# Patient Record
Sex: Female | Born: 2005 | Race: Black or African American | Hispanic: No | Marital: Single | Smoking: Never smoker
Health system: Southern US, Community
[De-identification: ages and names within clinical notes are randomized; demographics above are authoritative.]

## PROBLEM LIST (undated history)

## (undated) ENCOUNTER — Inpatient Hospital Stay (HOSPITAL_COMMUNITY): Payer: Self-pay

## (undated) DIAGNOSIS — I10 Essential (primary) hypertension: Secondary | ICD-10-CM

## (undated) DIAGNOSIS — I499 Cardiac arrhythmia, unspecified: Secondary | ICD-10-CM

## (undated) DIAGNOSIS — J45909 Unspecified asthma, uncomplicated: Secondary | ICD-10-CM

## (undated) DIAGNOSIS — E119 Type 2 diabetes mellitus without complications: Secondary | ICD-10-CM

## (undated) DIAGNOSIS — O24419 Gestational diabetes mellitus in pregnancy, unspecified control: Secondary | ICD-10-CM

## (undated) DIAGNOSIS — G43909 Migraine, unspecified, not intractable, without status migrainosus: Secondary | ICD-10-CM

## (undated) HISTORY — DX: Essential (primary) hypertension: I10

## (undated) HISTORY — DX: Migraine, unspecified, not intractable, without status migrainosus: G43.909

## (undated) HISTORY — PX: NO PAST SURGERIES: SHX2092

---
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
## 2023-01-01 ENCOUNTER — Other Ambulatory Visit: Payer: Self-pay | Admitting: Advanced Practice Midwife

## 2023-01-01 LAB — STREP GP B NAA: Strep Gp B NAA: NEGATIVE

## 2023-01-02 ENCOUNTER — Encounter (HOSPITAL_COMMUNITY): Payer: Self-pay | Admitting: Family Medicine

## 2023-01-02 ENCOUNTER — Inpatient Hospital Stay (HOSPITAL_COMMUNITY)
Admission: AD | Admit: 2023-01-02 | Discharge: 2023-01-03 | DRG: 807 | Disposition: A | Discharge status: Home / Self Care | Payer: Medicaid Other | Attending: Family Medicine | Admitting: Family Medicine

## 2023-01-02 ENCOUNTER — Telehealth (HOSPITAL_COMMUNITY): Payer: Self-pay | Admitting: *Deleted

## 2023-01-02 ENCOUNTER — Other Ambulatory Visit: Payer: Self-pay

## 2023-01-02 ENCOUNTER — Encounter (HOSPITAL_COMMUNITY): Payer: Self-pay

## 2023-01-02 DIAGNOSIS — Z91010 Allergy to peanuts: Secondary | ICD-10-CM

## 2023-01-02 DIAGNOSIS — O4202 Full-term premature rupture of membranes, onset of labor within 24 hours of rupture: Secondary | ICD-10-CM

## 2023-01-02 DIAGNOSIS — O9952 Diseases of the respiratory system complicating childbirth: Secondary | ICD-10-CM | POA: Diagnosis present

## 2023-01-02 DIAGNOSIS — O24425 Gestational diabetes mellitus in childbirth, controlled by oral hypoglycemic drugs: Principal | ICD-10-CM | POA: Diagnosis present

## 2023-01-02 DIAGNOSIS — O24419 Gestational diabetes mellitus in pregnancy, unspecified control: Secondary | ICD-10-CM

## 2023-01-02 DIAGNOSIS — Z3A37 37 weeks gestation of pregnancy: Secondary | ICD-10-CM

## 2023-01-02 DIAGNOSIS — O24429 Gestational diabetes mellitus in childbirth, unspecified control: Principal | ICD-10-CM | POA: Diagnosis present

## 2023-01-02 DIAGNOSIS — O99214 Obesity complicating childbirth: Secondary | ICD-10-CM | POA: Diagnosis present

## 2023-01-02 DIAGNOSIS — O24424 Gestational diabetes mellitus in childbirth, insulin controlled: Secondary | ICD-10-CM

## 2023-01-02 DIAGNOSIS — J4520 Mild intermittent asthma, uncomplicated: Secondary | ICD-10-CM | POA: Diagnosis present

## 2023-01-02 DIAGNOSIS — A749 Chlamydial infection, unspecified: Secondary | ICD-10-CM | POA: Diagnosis present

## 2023-01-02 DIAGNOSIS — O9832 Other infections with a predominantly sexual mode of transmission complicating childbirth: Secondary | ICD-10-CM

## 2023-01-02 DIAGNOSIS — O26893 Other specified pregnancy related conditions, third trimester: Secondary | ICD-10-CM | POA: Diagnosis present

## 2023-01-02 LAB — CBC
HCT: 31.1 % — ABNORMAL LOW (ref 36.0–46.0)
Hemoglobin: 10.1 g/dL — ABNORMAL LOW (ref 12.0–15.0)
MCH: 24.3 pg — ABNORMAL LOW (ref 26.0–34.0)
MCHC: 32.5 g/dL (ref 30.0–36.0)
MCV: 74.9 fL — ABNORMAL LOW (ref 80.0–100.0)
Platelets: 237 10*3/uL (ref 150–400)
RBC: 4.15 MIL/uL (ref 3.87–5.11)
RDW: 13.8 % (ref 11.5–15.5)
WBC: 5.4 10*3/uL (ref 4.0–10.5)
nRBC: 0 % (ref 0.0–0.2)

## 2023-01-02 LAB — CERVICOVAGINAL ANCILLARY ONLY
Chlamydia: POSITIVE — AB
Comment: NEGATIVE
Comment: NEGATIVE
Comment: NORMAL
Neisseria Gonorrhea: NEGATIVE
Trichomonas: NEGATIVE

## 2023-01-02 LAB — TYPE AND SCREEN
ABO/RH(D): A POS
Antibody Screen: NEGATIVE

## 2023-01-02 MED ORDER — MISOPROSTOL 25 MCG QUARTER TABLET: 25.0000 ug | ORAL_TABLET | Freq: Once | ORAL | Status: DC

## 2023-01-02 MED ORDER — PHENYLEPHRINE 80 MCG/ML (10ML) SYRINGE FOR IV PUSH (FOR BLOOD PRESSURE SUPPORT): 80.0000 ug | PREFILLED_SYRINGE | INTRAVENOUS | Status: DC | PRN

## 2023-01-02 MED ORDER — WITCH HAZEL-GLYCERIN EX PADS: 1.0000 | MEDICATED_PAD | CUTANEOUS | Status: DC | PRN

## 2023-01-02 MED ORDER — LIDOCAINE HCL (PF) 1 % IJ SOLN: 30.0000 mL | INTRAMUSCULAR | Status: DC | PRN

## 2023-01-02 MED ORDER — SENNOSIDES-DOCUSATE SODIUM 8.6-50 MG PO TABS: 2.0000 | ORAL_TABLET | ORAL | Status: DC

## 2023-01-02 MED ORDER — TETANUS-DIPHTH-ACELL PERTUSSIS 5-2.5-18.5 LF-MCG/0.5 IM SUSY: 0.5000 mL | PREFILLED_SYRINGE | Freq: Once | INTRAMUSCULAR | Status: DC

## 2023-01-02 MED ORDER — MISOPROSTOL 50MCG HALF TABLET: 50.0000 ug | ORAL_TABLET | Freq: Once | ORAL | Status: DC

## 2023-01-02 MED ORDER — EPHEDRINE 5 MG/ML INJ: 10.0000 mg | INTRAVENOUS | Status: DC | PRN

## 2023-01-02 MED ORDER — ONDANSETRON HCL 4 MG/2ML IJ SOLN: 4.0000 mg | Freq: Four times a day (QID) | INTRAMUSCULAR | Status: DC | PRN

## 2023-01-02 MED ORDER — LACTATED RINGERS IV SOLN: 500.0000 mL | Freq: Once | INTRAVENOUS | Status: DC

## 2023-01-02 MED ORDER — BENZOCAINE-MENTHOL 20-0.5 % EX AERO: 1.0000 | INHALATION_SPRAY | CUTANEOUS | Status: DC | PRN

## 2023-01-02 MED ORDER — COCONUT OIL OIL: 1.0000 | TOPICAL_OIL | Status: DC | PRN

## 2023-01-02 MED ORDER — DIPHENHYDRAMINE HCL 50 MG/ML IJ SOLN: 12.5000 mg | INTRAMUSCULAR | Status: DC | PRN

## 2023-01-02 MED ORDER — ACETAMINOPHEN 325 MG PO TABS: 650.0000 mg | ORAL_TABLET | ORAL | Status: DC | PRN

## 2023-01-02 MED ORDER — MEASLES, MUMPS & RUBELLA VAC IJ SOLR: 0.5000 mL | Freq: Once | INTRAMUSCULAR | Status: DC

## 2023-01-02 MED ORDER — DIBUCAINE (PERIANAL) 1 % EX OINT: 1.0000 | TOPICAL_OINTMENT | CUTANEOUS | Status: DC | PRN

## 2023-01-02 MED ORDER — OXYTOCIN-SODIUM CHLORIDE 30-0.9 UT/500ML-% IV SOLN
2.5000 [IU]/h | INTRAVENOUS | Status: DC
  Administered 2023-01-02: 2.5 [IU]/h via INTRAVENOUS
  Filled 2023-01-02: qty 500

## 2023-01-02 MED ORDER — LACTATED RINGERS IV SOLN: INTRAVENOUS | Status: DC

## 2023-01-02 MED ORDER — DIPHENHYDRAMINE HCL 25 MG PO CAPS: 25.0000 mg | ORAL_CAPSULE | Freq: Four times a day (QID) | ORAL | Status: DC | PRN

## 2023-01-02 MED ORDER — PRENATAL MULTIVITAMIN CH: 1.0000 | ORAL_TABLET | Freq: Every day | ORAL | Status: DC

## 2023-01-02 MED ORDER — FENTANYL-BUPIVACAINE-NACL 0.5-0.125-0.9 MG/250ML-% EP SOLN
EPIDURAL | Status: AC
  Filled 2023-01-02: qty 250

## 2023-01-02 MED ORDER — SIMETHICONE 80 MG PO CHEW: 80.0000 mg | CHEWABLE_TABLET | ORAL | Status: DC | PRN

## 2023-01-02 MED ORDER — ONDANSETRON HCL 4 MG/2ML IJ SOLN: 4.0000 mg | INTRAMUSCULAR | Status: DC | PRN

## 2023-01-02 MED ORDER — LACTATED RINGERS IV SOLN: 500.0000 mL | INTRAVENOUS | Status: DC | PRN

## 2023-01-02 MED ORDER — OXYTOCIN BOLUS FROM INFUSION
333.0000 mL | Freq: Once | INTRAVENOUS | Status: AC
  Administered 2023-01-02: 333 mL via INTRAVENOUS

## 2023-01-02 MED ORDER — ONDANSETRON HCL 4 MG PO TABS: 4.0000 mg | ORAL_TABLET | ORAL | Status: DC | PRN

## 2023-01-02 MED ORDER — TERBUTALINE SULFATE 1 MG/ML IJ SOLN: 0.2500 mg | Freq: Once | INTRAMUSCULAR | Status: DC | PRN

## 2023-01-02 MED ORDER — OXYCODONE-ACETAMINOPHEN 5-325 MG PO TABS: 2.0000 | ORAL_TABLET | ORAL | Status: DC | PRN

## 2023-01-02 MED ORDER — IBUPROFEN 600 MG PO TABS
600.0000 mg | ORAL_TABLET | Freq: Four times a day (QID) | ORAL | Status: DC
  Administered 2023-01-03: 600 mg via ORAL
  Filled 2023-01-02 (×2): qty 1

## 2023-01-02 MED ORDER — OXYCODONE-ACETAMINOPHEN 5-325 MG PO TABS: 1.0000 | ORAL_TABLET | ORAL | Status: DC | PRN

## 2023-01-02 MED ORDER — FENTANYL-BUPIVACAINE-NACL 0.5-0.125-0.9 MG/250ML-% EP SOLN: 12.0000 mL/h | EPIDURAL | Status: DC | PRN

## 2023-01-02 NOTE — H&P (Signed)
OBSTETRIC ADMISSION HISTORY AND PHYSICAL  Chelsea Herman is a 17 y.o. female G2P1001 with IUP at [redacted]w[redacted]d presenting for SROM and active labor. States labor started 30 min ago. Reports taking castor oil last night. She reports +FMs. No LOF, VB, blurry vision, headaches, peripheral edema, or RUQ pain. She plans on breastfeeding. She is undecided on birth control.  Dating: By early [place] --->  Estimated Date of Delivery: 01/23/23  Sono:    @[redacted]w[redacted]d , normal anatomy, cephalic presentation, 3762G, 60%ile, EFW 5'  Prenatal History/Complications: - teen pregnancy - A2GDM, poor control - Chlamydia  Past Medical History: Past Medical History:  Diagnosis Date   Asthma    Diabetes mellitus without complication ([place])    Gestational diabetes    History of gestational hypertension 02/04/2021    Past Surgical History: Past Surgical History:  Procedure Laterality Date   NO PAST SURGERIES      Obstetrical History: OB History     Gravida  2   Para  1   Term  1   Preterm  0   AB  0   Living  1      SAB  0   IAB  0   Ectopic  0   Multiple      Live Births  1           Social History: Social History   Socioeconomic History   Marital status: Single    Spouse name: Not on file   Number of children: Not on file   Years of education: Not on file   Highest education level: Not on file  Occupational History   Not on file  Tobacco Use   Smoking status: Never    Passive exposure: Never   Smokeless tobacco: Never  Vaping Use   Vaping Use: Never used  Substance and Sexual Activity   Alcohol use: Not Currently   Drug use: Not Currently   Sexual activity: Not Currently    Partners: Male    Birth control/protection: None  Other Topics Concern   Not on file  Social History Narrative   ** Merged History Encounter **       Social Determinants of Health   Financial Resource Strain: Not on file  Food Insecurity: Not on file  Transportation Needs: Not on file  Physical  Activity: Not on file  Stress: Not on file  Social Connections: Not on file    Family History: Family History  Problem Relation Age of Onset   Hearing loss Mother    Asthma Neg Hx    Cancer Neg Hx    Diabetes Neg Hx    Stroke Neg Hx     Allergies: Allergies  Allergen Reactions   Peanut-Containing Drug Products Anaphylaxis   Citric Acid Rash    Medications Prior to Admission  Medication Sig Dispense Refill Last Dose   Accu-Chek Softclix Lancets lancets Use 1 lancet to check blood glucose 4 times daily 100 each 6    aspirin EC 81 MG tablet Take 1 tablet (81 mg total) by mouth daily. Take after 12 weeks for prevention of preeclampsia later in pregnancy (Patient not taking: Reported on 11/08/2022) 300 tablet 2    Blood Pressure Monitoring (BLOOD PRESSURE KIT) DEVI 1 kit by Does not apply route once a week. 1 each 0    glucose blood (ACCU-CHEK GUIDE) test strip Use 1 test strip to check blood glucose 4 times daily 100 each 6    metFORMIN (GLUCOPHAGE-XR) 500 MG 24  hr tablet Take 2 tablets (1,000 mg total) by mouth 2 (two) times daily with a meal. 120 tablet 5    metoCLOPramide (REGLAN) 10 MG tablet Take 1 tablet (10 mg total) by mouth every 6 (six) hours. (Patient not taking: Reported on 07/05/2022) 30 tablet 0    Prenatal Vit-Fe Fumarate-FA (PREPLUS) 27-1 MG TABS Take 1 tablet by mouth daily. 30 tablet 13      Review of Systems:  All systems reviewed and negative except as stated in HPI  PE: Last menstrual period 03/30/2022, currently breastfeeding. General appearance: alert, cooperative, and moderate distress Lungs: regular rate and effort Heart: regular rate  Abdomen: soft, non-tender Extremities: Homans sign is negative, no sign of DVT Presentation: cephalic EFM: 150 bpm, mod variability, no accels, variable decels Toco: q3-4 Dilation: 9 Exam by:: Shelly Coss, CNM [facility]  Prenatal labs: ABO, Rh: A/Positive/-- (07/17 1029) Antibody: Negative (07/17 1029) Rubella: 2.11 (07/17  1029) RPR: Non Reactive (11/06 1034)  HBsAg: Negative (07/17 1029)  HIV: Non Reactive (11/06 1034)  GBS: Negative/-- (01/05 1249)  2 hr GTT 85/202/140  Prenatal Transfer Tool  Maternal Diabetes: Yes:  Diabetes Type:  Insulin/Medication controlled Genetic Screening: Normal Maternal Ultrasounds/Referrals: Normal Fetal Ultrasounds or other Referrals:  Referred to Materal Fetal Medicine  Maternal Substance Abuse:  No Significant Maternal Medications:  Meds include: Other: Metformin Significant Maternal Lab Results: Group B Strep negative  No results found for this or any previous visit (from the past 24 hour(s)).  Patient Active Problem List   Diagnosis Date Noted   Gestational diabetes mellitus in childbirth, unspecified control 01/02/2023   Chlamydia infection complicating pregnancy 11/15/2022   Gestational diabetes mellitus (GDM) affecting pregnancy, antepartum 11/01/2022   Maternal obesity affecting pregnancy, antepartum 10/31/2022   Supervision of other normal pregnancy, antepartum 07/05/2022   Intrauterine pregnancy in teenager 06/04/2022   Subchorionic hematoma in first trimester 06/04/2022   History of gestational hypertension 02/04/2021   GERD (gastroesophageal reflux disease) 09/08/2020   Mild intermittent asthma without complication 08/07/2017   Peanut allergy 08/07/2017    Assessment: Chelsea Herman is a 17 y.o. G2P1001 at [redacted]w[redacted]d here for SROM and labor  1. Labor: active 2. FWB: Cat II 3. Pain: desires epidural 4. GBS: neg   Plan: Admit to LD Anticipate precipitous SVD  Donette Larry, CNM  01/02/2023, 2:27 PM

## 2023-01-02 NOTE — Telephone Encounter (Signed)
Preadmission screen  

## 2023-01-02 NOTE — Lactation Note (Signed)
This note was copied from a baby's chart. Lactation Consultation Note  Patient Name: Chelsea Herman [id number] Date: 01/02/2023 Reason for consult: Initial assessment;Early term 37-38.6wks;Maternal endocrine disorder Age:19 hours RN had just given baby glucose gel. LC attempted to get baby to latch to the breast. Only latched a couple of times w/a couple of sucks. Baby was jittery, and sleepy. LC collected some colostrum and spoon fed baby then laid him on mom's chest STS and covered. Discussed w/mom the need to supplement for at least 24 hrs to keep glucose levels good. Discussed how it takes time for baby to regulate himself and will need to be supplemented. Discussed w/mom pumping.  Mom shown how to use DEBP & how to disassemble, clean, & reassemble parts. Discussed pumping q3hrs. Mom concerned baby will not want her breast if given a bottle. LC gave mom a foley cup to give supplement with since mom has short shafted nipples. Mom stated she BF her daughter for 15 months w/o any difficulty. Mom didn't have any issues latching. Mom stated this baby is different not latching as well as her daughter. Mom encouraged to feed baby 8-12 times/24 hours and with feeding cues.  Encouraged to call for assistance or questions.  Maternal Data Has patient been taught Hand Expression?: Yes Does the patient have breastfeeding experience prior to this delivery?: Yes How long did the patient breastfeed?: 1 yr and 3 months  Feeding    LATCH Score Latch: Too sleepy or reluctant, no latch achieved, no sucking elicited.  Audible Swallowing: None  Type of Nipple: Everted at rest and after stimulation (semi flat/everts w/stimulation)  Comfort (Breast/Nipple): Soft / non-tender  Hold (Positioning): Assistance needed to correctly position infant at breast and maintain latch.  LATCH Score: 5   Lactation Tools Discussed/Used Tools: Pump;Shells;Flanges Flange Size: 21 Breast pump type: Double-Electric  Breast Pump Pump Education: Setup, frequency, and cleaning;Milk Storage Reason for Pumping: supplementation Pumping frequency: q3hr  Interventions Interventions: Breast feeding basics reviewed;Adjust position;DEBP;Assisted with latch;Support pillows;Skin to skin;Position options;Breast massage;Expressed milk;Hand express;LC Magazine features editor;Shells;Pre-pump if needed;Breast compression  Discharge    Consult Status Consult Status: Follow-up Date: 01/03/23 Follow-up type: In-patient    Theodoro Kalata 01/02/2023, 8:53 PM

## 2023-01-02 NOTE — Discharge Summary (Signed)
Postpartum Discharge Summary  Date of Service updated***     Patient Name: Chelsea Herman DOB: 01/31/2006 MRN: [id number]  Date of admission: 01/02/2023 Delivery date:01/02/2023  Delivering provider: Donette Larry  Date of discharge: 01/02/2023  Admitting diagnosis: Gestational diabetes mellitus in childbirth, unspecified control [O24.429] Intrauterine pregnancy: [redacted]w[redacted]d     Secondary diagnosis:  Principal Problem:   Gestational diabetes mellitus in childbirth, unspecified control Active Problems:   Chlamydia infection complicating pregnancy   SVD (spontaneous vaginal delivery)  Additional problems: precipitous labor    Discharge diagnosis: Term Pregnancy Delivered and GDM A2                                              Post partum procedures:{Postpartum procedures:23558} Augmentation: N/A Complications: None  Hospital course: Onset of Labor With Vaginal Delivery      17 y.o. yo G2P1001 at [redacted]w[redacted]d was admitted in Active Labor on 01/02/2023. Labor course was uncomplicated. Membrane Rupture Time/Date: 1:30 PM ,01/02/2023   Delivery Method:Vaginal, Spontaneous  Episiotomy: None  Lacerations:  1st degree  Patient had a postpartum course complicated by ***.  She is ambulating, tolerating a regular diet, passing flatus, and urinating well. Patient is discharged home in stable condition on 01/02/23.  Newborn Data: Birth date:01/02/2023  Birth time:2:30 PM  Gender:Female  Living status:Living  Apgars:8 ,8  Weight:   Magnesium Sulfate received: {Mag received:30440022} BMZ received: {BMZ received:30440023} Rhophylac:{Rhophylac received:30440032} PPI:{RJJ:88416606} T-DaP:{Tdap:23962} Flu: {TKZ:60109} Transfusion:{Transfusion received:30440034}  Physical exam  Vitals:   01/02/23 1426 01/02/23 1443 01/02/23 1451  BP: 133/78 133/83 128/86  Pulse: 97 75 62  Resp:  18   Temp:  98.4 F (36.9 C)   TempSrc:  Axillary    General: {Exam; general:21111117} Lochia: {Desc;  appropriate/inappropriate:30686::"appropriate"} Uterine Fundus: {Desc; firm/soft:30687} Incision: {Exam; incision:21111123} DVT Evaluation: {Exam; dvt:2111122} Labs: Lab Results  Component Value Date   WBC 5.4 01/02/2023   HGB 10.1 (L) 01/02/2023   HCT 31.1 (L) 01/02/2023   MCV 74.9 (L) 01/02/2023   PLT 237 01/02/2023      Latest Ref Rng & Units 07/11/2022   10:29 AM  CMP  Glucose 70 - 99 mg/dL 98   BUN 5 - 18 mg/dL 4   Creatinine 3.23 - 5.57 mg/dL 3.22   Sodium 025 - 427 mmol/L 135   Potassium 3.5 - 5.2 mmol/L 3.9   Chloride 96 - 106 mmol/L 103   CO2 20 - 29 mmol/L 19   Calcium 8.9 - 10.4 mg/dL 9.2   Total Protein 6.0 - 8.5 g/dL 6.7   Total Bilirubin 0.0 - 1.2 mg/dL 0.2   Alkaline Phos 47 - 113 IU/L 81   AST 0 - 40 IU/L 15   ALT 0 - 24 IU/L 10    Edinburgh Score:    04/14/2021    1:45 PM  Edinburgh Postnatal Depression Scale Screening Tool  I have been able to laugh and see the funny side of things. 0  I have looked forward with enjoyment to things. 0  I have blamed myself unnecessarily when things went wrong. 0  I have been anxious or worried for no good reason. 0  I have felt scared or panicky for no good reason. 0  Things have been getting on top of me. 0  I have been so unhappy that I have had difficulty sleeping. 0  I have  felt sad or miserable. 0  I have been so unhappy that I have been crying. 0  The thought of harming myself has occurred to me. 0  Edinburgh Postnatal Depression Scale Total 0     After visit meds:  Allergies as of 01/02/2023       Reactions   Peanut-containing Drug Products Anaphylaxis   Citric Acid Rash     Med Rec must be completed prior to using this [place]***        Discharge home in stable condition Infant Feeding: {Baby feeding:23562} Infant Disposition:{CHL IP OB HOME WITH RCVELF:81017} Discharge instruction: per After Visit Summary and Postpartum booklet. Activity: Advance as tolerated. Pelvic rest for 6 weeks.   Diet: {OB PZWC:58527782} Future Appointments: Future Appointments  Date Time Provider [place]  01/03/2023  7:15 AM [facility] SCHED ROOM [facility] None  01/06/2023  9:55 AM Constant, Vickii Chafe, MD [facility] None  01/09/2023  3:30 PM [facility] NURSE [facility]  01/09/2023  3:45 PM [facility] US1 [facility]  01/13/2023  9:55 AM Constant, Vickii Chafe, MD [facility] None  01/19/2023 11:15 AM Constant, Vickii Chafe, MD [place] None   Follow up Visit:  Message sent by Drake Leach, CNM on 01/02/23: Please schedule this patient for a In person postpartum visit in 6 weeks with the following provider: Any provider. Additional Postpartum F/U:2 hour GTT  Low risk pregnancy complicated by: GDM Delivery mode:  Vaginal, Spontaneous  Anticipated Birth Control:  Unsure   01/02/2023 Julianne Handler, CNM

## 2023-01-02 NOTE — MAU Note (Addendum)
Patient arrived to MAU with complaints of "something hanging out of her vagina." MAU registration notified this RN immediately. RN immediately went to the lobby to retrieve patient. Mason Jim, NT, retrieved Gavin Pound, CNM, immediately.   Patient brought to the room and stated she thought it was her mucous plug that was hanging out. CNM at bedside immediately. No cord noted externally. RN applied monitors. FHR reassuring at 145 initially. CNM performed SVE with patient consent. Patient found to be 9 cm and grossly ruptured with meconium. No cord felt.  First Call L&D notified and report given.  Reather Converse, RN, notified L&D Charge RN and report given. Patient to go to room 213.   Gavin Pound, CNM, and Reather Converse, RN, transported patient to L&D.

## 2023-01-03 ENCOUNTER — Inpatient Hospital Stay (HOSPITAL_COMMUNITY)
Admission: RE | Admit: 2023-01-03 | Payer: Medicaid Other | Source: Home / Self Care | Admitting: Obstetrics & Gynecology

## 2023-01-03 ENCOUNTER — Inpatient Hospital Stay (HOSPITAL_COMMUNITY): Payer: Medicaid Other

## 2023-01-03 LAB — RPR: RPR Ser Ql: NONREACTIVE

## 2023-01-03 LAB — GLUCOSE, CAPILLARY: Glucose-Capillary: 81 mg/dL (ref 70–99)

## 2023-01-03 MED ORDER — IBUPROFEN 600 MG PO TABS: 600.0000 mg | ORAL_TABLET | Freq: Four times a day (QID) | ORAL | 0 refills | Status: DC

## 2023-01-03 MED ORDER — ACETAMINOPHEN 325 MG PO TABS: 650.0000 mg | ORAL_TABLET | ORAL | 0 refills | Status: DC | PRN

## 2023-01-03 NOTE — Lactation Note (Signed)
This note was copied from a baby's chart. Lactation Consultation Note  Patient Name: Boy Nahia Nissan [id number] Date: 01/03/2023 Reason for consult: Follow-up assessment;Early term 37-38.6wks, -1.11% weight loss, see Birth Parent MR- hx GDM Age:19 hours Birth Parent current feeding preference  is : Breast and formula feeding infant. LC did not observe latch due Birth Parent BF infant for 10 minutes and supplement infant with 10 mls of formula at 1730 pm. Infant appeared content in basinet. Birth Parent is experienced with breast feeding see maternal data below. Per Birth Parent, infant latches well just little soreness infant has strong latch will apply EBM let air dry or use coconut oil. Birth Parent doesn't have abrasions on nipples is using the DEBP to pre-pump breast prior to latching infant, not been using the breast shells that was given previously by LC. LC reviewed importance of using the DEBP to help stimulate and establish her milk supply , Birth Parent mostly been using hand pump.  LC reviewed how to use DEBP with Birth Parent, she will pump later today as LC end of [facility] consult family members enter the room. Birth Parent current feeding plan: 1- Will continue to pre-pump breast prior to  chest feeding  infant according to hunger cues, on demand 8+ times within 24 hours, STS. 2- Plans to limit total feedings to 30 minutes or less, 20 minutes at the breast or less and then 10 minutes supplementing infant with any EBM 1st and then formula.Birth Parent has BF supplemental sheet  and plans to offer Day 2  (68mls or more),  if infant wants it after infant latches at the breast. 3- Will start using the DEBP every 3 hours for 15 minutes on initial setting. 4- Birth Parent knows to call [place] if their are any BF questions, concerns or if she needs latch assistance. Maternal Data Does the patient have breastfeeding experience prior to this delivery?: Yes How long did the patient breastfeed?: Birth  Parent BF 1st child for 15 months who is currently less than 11 years old.  Feeding Mother's Current Feeding Choice: Breast Milk and Formula Nipple Type: Slow - flow  LATCH Score                    Lactation Tools Discussed/Used    Interventions Interventions: Education;Skin to skin;Pre-pump if needed;Shells;Breast compression;Hand pump;DEBP  Discharge    Consult Status Consult Status: Follow-up Date: 01/04/23 Follow-up type: In-patient    Eulis Canner 01/03/2023, 6:33 PM

## 2023-01-04 ENCOUNTER — Ambulatory Visit: Payer: Self-pay

## 2023-01-04 NOTE — Lactation Note (Addendum)
This note was copied from a baby's chart. Lactation Consultation Note  Patient Name: Chelsea Herman [id number] Date: 01/04/2023 Age: 19 hours, experienced BF x 13 months per mom.  Reason for consult: Follow-up assessment;Early term 37-38.6wks;Maternal endocrine disorder;Infant weight loss (4 % weight loss. LC reviewed and updated the doc flow sheets per  mom for the last 2 feedings. Last serum blood sugar 62. [place] for D/C) Baby has received several bottles. LC reviewed supply and demand, importance of giving the baby a chance to latch as much as possible to work on the BF and if baby is still hungry give the baby the opporttunity to latch on the 2nd breast.  Mom has [facility] Medicaid, [facility] doesn't accept it.  Per mom has not been active with this pregnancy with WIC .  LC mentioned to mom a [facility] referral would be sent today to [facility].  LC recommended since she is going home with just a hand pump - prior to latching on the 1st breast - breast massage , hand express, pre-pump to enhance the milk coming in.  LC reviewed BF D/C teaching and the [facility] resources after D/C.  LC provided mom information to [facility] and [facility] so she can call them.  LC sent a [place] referral to [place] as mom requested. Faxed.  Maternal Data  Per mom BF her 1st baby 13 months . [place] praised her for the 13 months and LC mentioned the benefit to her 2nd baby.   Feeding Mother's Current Feeding Choice: Breast Milk and Formula Nipple Type: Slow - flow  LATCH Score - LC unable to check a latch , per mom the baby fed at the breast at 1:30 pm for 15 mins.     Lactation Tools Discussed/Used Tools: Shells;Pump Flange Size: 21 Breast pump type: Manual;Double-Electric Breast Pump Pump Education: Milk Storage  Interventions Interventions: Breast feeding basics reviewed;Education;DEBP;Hand pump;LC Services brochure  Discharge Discharge Education: Engorgement and breast care;Warning signs for feeding baby Pump: Manual WIC Program:  No  Consult Status Consult Status: Complete Date: 01/04/23    Myer Haff 01/04/2023, 2:53 PM

## 2023-01-05 ENCOUNTER — Other Ambulatory Visit: Payer: Self-pay

## 2023-01-05 ENCOUNTER — Telehealth: Payer: Self-pay

## 2023-01-05 ENCOUNTER — Encounter (HOSPITAL_COMMUNITY): Payer: Self-pay | Admitting: Obstetrics and Gynecology

## 2023-01-05 ENCOUNTER — Inpatient Hospital Stay (HOSPITAL_COMMUNITY)
Admission: AD | Admit: 2023-01-05 | Discharge: 2023-01-05 | Disposition: A | Discharge status: Home / Self Care | Payer: Medicaid Other | Attending: Obstetrics and Gynecology | Admitting: Obstetrics and Gynecology

## 2023-01-05 DIAGNOSIS — N939 Abnormal uterine and vaginal bleeding, unspecified: Secondary | ICD-10-CM | POA: Diagnosis present

## 2023-01-05 DIAGNOSIS — G44209 Tension-type headache, unspecified, not intractable: Secondary | ICD-10-CM | POA: Diagnosis not present

## 2023-01-05 DIAGNOSIS — O9089 Other complications of the puerperium, not elsewhere classified: Secondary | ICD-10-CM | POA: Insufficient documentation

## 2023-01-05 MED ORDER — DOXYCYCLINE MONOHYDRATE 100 MG PO TABS: 100.0000 mg | ORAL_TABLET | Freq: Two times a day (BID) | ORAL | 0 refills | Status: DC

## 2023-01-05 NOTE — Telephone Encounter (Signed)
Contacted pt about results and treatment, no answer, left vm

## 2023-01-05 NOTE — MAU Provider Note (Signed)
History     CSN: [id number]  Arrival date and time: 01/05/23 1123   Event Date/Time   First Provider Initiated Contact with Patient 01/05/23 1147      Chief Complaint  Patient presents with   Headache   Vaginal Bleeding   HPI  Chelsea Herman is a 17 y.o. G2P2002 postpartum from a vaginal delivery on 1/8 who presents for evaluation of headache and vaginal bleeding. Patient reports she was told if she got a headache or bleeding to return to the hospital and she has both so she came in. Patient rates the pain as a 6/10 and has not tried anything for the pain. She has not eaten or drank any water today. She reports the bleeding is currently minimal but she saw a clot yesterday so she came in.   OB History     Gravida  2   Para  2   Term  2   Preterm  0   AB  0   Living  2      SAB  0   IAB  0   Ectopic  0   Multiple  0   Live Births  2           Past Medical History:  Diagnosis Date   Asthma    Diabetes mellitus without complication ([place])    Gestational diabetes    History of gestational hypertension 02/04/2021    Past Surgical History:  Procedure Laterality Date   NO PAST SURGERIES      Family History  Problem Relation Age of Onset   Hearing loss Mother    Asthma Neg Hx    Cancer Neg Hx    Diabetes Neg Hx    Stroke Neg Hx     Social History   Tobacco Use   Smoking status: Never    Passive exposure: Never   Smokeless tobacco: Never  Vaping Use   Vaping Use: Never used  Substance Use Topics   Alcohol use: Not Currently   Drug use: Not Currently    Allergies:  Allergies  Allergen Reactions   Peanut-Containing Drug Products Anaphylaxis   Citric Acid Rash    No medications prior to admission.    Review of Systems  Constitutional: Negative.  Negative for fatigue and fever.  HENT: Negative.    Respiratory: Negative.  Negative for shortness of breath.   Cardiovascular: Negative.  Negative for chest pain.  Gastrointestinal:  Negative.  Negative for abdominal pain, constipation, diarrhea, nausea and vomiting.  Genitourinary:  Positive for vaginal bleeding. Negative for dysuria.  Neurological:  Positive for headaches. Negative for dizziness.   Physical Exam   Blood pressure 112/63, pulse 83, temperature 98.3 F (36.8 C), temperature source Oral, resp. rate 16, height 5\' 3"  (1.6 m), weight 82.6 kg, SpO2 100 %, currently breastfeeding.  Patient Vitals for the past 24 hrs:  BP Temp Temp src Pulse Resp SpO2 Height Weight  01/05/23 1146 112/63 -- -- 83 -- -- -- --  01/05/23 1144 -- -- -- -- -- 100 % -- --  01/05/23 1143 114/69 -- -- 75 -- -- -- --  01/05/23 1133 129/66 98.3 F (36.8 C) Oral 71 16 98 % -- --  01/05/23 1129 -- -- -- -- -- -- 5\' 3"  (1.6 m) 82.6 kg    Physical Exam Vitals and nursing note reviewed.  Constitutional:      General: She is not in acute distress.    Appearance: She is well-developed.  HENT:     Head: Normocephalic.  Eyes:     Pupils: Pupils are equal, round, and reactive to light.  Cardiovascular:     Rate and Rhythm: Normal rate and regular rhythm.     Heart sounds: Normal heart sounds.  Pulmonary:     Effort: Pulmonary effort is normal. No respiratory distress.     Breath sounds: Normal breath sounds.  Abdominal:     General: Bowel sounds are normal. There is no distension.     Palpations: Abdomen is soft.     Tenderness: There is no abdominal tenderness.  Skin:    General: Skin is warm and dry.  Neurological:     Mental Status: She is alert and oriented to person, place, and time.  Psychiatric:        Mood and Affect: Mood normal.        Behavior: Behavior normal.        Thought Content: Thought content normal.        Judgment: Judgment normal.     MAU Course  Procedures  Patient declines speculum exam and agreeable to let CNM look at pad. Minimal bright red blood on pad. Reassurance provided of normalcy.  Offered treatment of HA and patient declines. Discussed  importance of PO hydration and eating regularly in the postpartum period.  Assessment and Plan   1. Acute non intractable tension-type headache   2. Postpartum state     -Discharge home in stable condition -Postpartum precautions discussed -Patient advised to follow-up with OB as scheduled for postpartum care -Patient may return to MAU as needed or if her condition were to change or worsen  Wende Mott, CNM 01/05/2023, 11:47 AM

## 2023-01-05 NOTE — MAU Note (Signed)
Chelsea Herman is a 17 y.o. here in MAU reporting: s/p SVD on 1/8. Was told to return to the hospital with any signs of PIH and increased bleeding. Is having headaches, spots in her vision, and bleeding. This all started last night.  Onset of complaint: last night  Pain score: 7/10  Vitals:   01/05/23 1133  BP: 129/66  Pulse: 71  Resp: 16  Temp: 98.3 F (36.8 C)  SpO2: 98%     FHT:NA  Lab orders placed from triage: none

## 2023-01-06 ENCOUNTER — Telehealth: Payer: Self-pay

## 2023-01-06 ENCOUNTER — Encounter: Payer: Self-pay | Admitting: Obstetrics and Gynecology

## 2023-01-06 NOTE — Telephone Encounter (Signed)
2nd attempt to contact pt about results and rx sent. Left vm to call

## 2023-01-09 ENCOUNTER — Ambulatory Visit: Payer: Medicaid Other

## 2023-01-10 ENCOUNTER — Telehealth (HOSPITAL_COMMUNITY): Payer: Self-pay | Admitting: *Deleted

## 2023-01-10 NOTE — Telephone Encounter (Signed)
Left phone voicemail message.  Odis Hollingshead, RN 01-10-2023 at 10:13am

## 2023-01-13 ENCOUNTER — Encounter: Payer: Self-pay | Admitting: Obstetrics and Gynecology

## 2023-01-19 ENCOUNTER — Encounter: Payer: Self-pay | Admitting: Student

## 2023-01-19 ENCOUNTER — Encounter: Payer: Self-pay | Admitting: Obstetrics and Gynecology

## 2023-01-30 ENCOUNTER — Encounter (HOSPITAL_COMMUNITY): Payer: Self-pay | Admitting: Emergency Medicine

## 2023-01-30 ENCOUNTER — Inpatient Hospital Stay (HOSPITAL_COMMUNITY)
Admission: AD | Admit: 2023-01-30 | Discharge: 2023-01-30 | Disposition: A | Discharge status: Home / Self Care | Payer: Medicaid Other | Attending: Family Medicine | Admitting: Family Medicine

## 2023-01-30 ENCOUNTER — Other Ambulatory Visit: Payer: Self-pay

## 2023-01-30 DIAGNOSIS — N898 Other specified noninflammatory disorders of vagina: Secondary | ICD-10-CM

## 2023-01-30 DIAGNOSIS — A749 Chlamydial infection, unspecified: Secondary | ICD-10-CM | POA: Diagnosis not present

## 2023-01-30 DIAGNOSIS — B3731 Acute candidiasis of vulva and vagina: Secondary | ICD-10-CM | POA: Diagnosis not present

## 2023-01-30 DIAGNOSIS — R309 Painful micturition, unspecified: Secondary | ICD-10-CM | POA: Diagnosis not present

## 2023-01-30 DIAGNOSIS — K6289 Other specified diseases of anus and rectum: Secondary | ICD-10-CM | POA: Insufficient documentation

## 2023-01-30 DIAGNOSIS — K5900 Constipation, unspecified: Secondary | ICD-10-CM | POA: Diagnosis not present

## 2023-01-30 DIAGNOSIS — K629 Disease of anus and rectum, unspecified: Secondary | ICD-10-CM | POA: Diagnosis not present

## 2023-01-30 DIAGNOSIS — O9883 Other maternal infectious and parasitic diseases complicating the puerperium: Secondary | ICD-10-CM | POA: Insufficient documentation

## 2023-01-30 LAB — URINALYSIS, ROUTINE W REFLEX MICROSCOPIC
Bilirubin Urine: NEGATIVE
Glucose, UA: NEGATIVE mg/dL
Ketones, ur: NEGATIVE mg/dL
Nitrite: NEGATIVE
Protein, ur: NEGATIVE mg/dL
Specific Gravity, Urine: 1.019 (ref 1.005–1.030)
WBC, UA: >50 WBC/hpf (ref 0–5)
pH: 7 (ref 5.0–8.0)

## 2023-01-30 LAB — WET PREP, GENITAL
Clue Cells Wet Prep HPF POC: NONE SEEN
Sperm: NONE SEEN
Trich, Wet Prep: NONE SEEN
WBC, Wet Prep HPF POC: >=10 — AB (ref <10)
Yeast Wet Prep HPF POC: NONE SEEN

## 2023-01-30 LAB — GC/CHLAMYDIA PROBE AMP (~~LOC~~) NOT AT ARMC
Chlamydia: NEGATIVE
Comment: NEGATIVE
Comment: NORMAL
Neisseria Gonorrhea: NEGATIVE

## 2023-01-30 MED ORDER — WITCH HAZEL-GLYCERIN EX PADS
MEDICATED_PAD | CUTANEOUS | Status: DC | PRN
  Administered 2023-01-30: 1 via TOPICAL

## 2023-01-30 MED ORDER — NYSTATIN 100000 UNIT/GM EX CREA: TOPICAL_CREAM | CUTANEOUS | 0 refills | Status: DC

## 2023-01-30 MED ORDER — AZITHROMYCIN 250 MG PO TABS
1000.0000 mg | ORAL_TABLET | Freq: Once | ORAL | Status: AC
  Administered 2023-01-30: 1000 mg via ORAL
  Filled 2023-01-30: qty 4

## 2023-01-30 MED ORDER — BENZOCAINE-MENTHOL 20-0.5 % EX AERO
1.0000 | INHALATION_SPRAY | Freq: Four times a day (QID) | CUTANEOUS | Status: DC | PRN
  Administered 2023-01-30: 1 via TOPICAL
  Filled 2023-01-30: qty 56

## 2023-01-30 NOTE — MAU Note (Addendum)
.  Chelsea Herman is a 17 y.o. transfer from [facility] ED to MAU reporting: watery vaginal discharge, burning and "feels like she has a rip in her anus" States she has an anal fissure -had vaginal sex 1 week ago and has cloudy watery discharge with "slight" odor that began 1.5 weeks ago. Reports the watery discharge saturates a peri pad twice a day. Pt did an Epson salt bath and pushing po fluids. Also reports burning and pressure with urination. States rectal pain is due to constipation. Pt had a vaginal delivery 01/02/2023. Pt showed RN cellphone picture of labia-picture shows clear raised lesions SO present at bedside with patiient Pain score: 7 skin tag on anus And pain with urination Vitals:   01/30/23 0128 01/30/23 0217  BP:  117/63  Pulse:  (!) 53  Resp: 16 16  Temp: 97.9 F (36.6 C) 99.1 F (37.3 C)  SpO2:        Lab orders placed from triage:  UA

## 2023-01-30 NOTE — ED Provider Triage Note (Signed)
Emergency Medicine Provider OB Triage Evaluation Note  Chelsea Herman is a 17 y.o. female, G2P2002, at Unknown gestation who presents to the emergency department with complaints of vaginal charge, pain, rectal pain.  Notes having delivered a baby on January 02, 2023, vaginal delivery, no reports of sutures placed.  Reports having intercourse 1 week ago before she is medically cleared to do so.  States that she is having a watery vaginal discharge with vaginal pain as well as pain at her rectum.  Review of  Systems  Positive: As above Negative: Fever  Physical Exam  BP 119/82   Pulse 74   Temp 97.9 F (36.6 C) (Oral)   Resp 16   Ht 5\' 5"  (1.651 m)   Wt 77.1 kg   SpO2 96%   Breastfeeding Yes   BMI 28.29 kg/m  General: Awake, no distress  HEENT: Atraumatic  Resp: Normal effort  Cardiac: Normal rate Abd: Nondistended, nontender  MSK: Moves all extremities without difficulty Neuro: Speech clear Chaperone present for exam, has brown-tinged vaginal discharge with tenderness through the labia, likely hemorrhoid, question surrounding lesions possibly related to HSV  Medical Decision Making  Pt evaluated for pregnancy concern and is stable for transfer to MAU. Pt is in agreement with plan for transfer.  1:37 AM Discussed with MAU APP,  who accepts patient in transfer.  Clinical Impression  No diagnosis found.     Tacy Learn, PA-C 01/30/23 [id number]

## 2023-01-30 NOTE — ED Triage Notes (Addendum)
Pt reports watery discharge after vaginal delivery last month. Pt also reports burning with urination and a "rip" in rectum.

## 2023-01-30 NOTE — MAU Provider Note (Signed)
History     CSN: [id number]  Arrival date and time: 01/30/23 0101   Event Date/Time   First Provider Initiated Contact with Patient 01/30/23 0246      Chief Complaint  Patient presents with   Vaginal Discharge   Chelsea Herman , a  17 y.o. G2P2002 at 3 weeks PP presents to MAU with complaints of vaginal and rectal pain with intercourse. Patient states she resumed sex "soon" after delivery and again x1 week ago, but states she had to stop because it "hurt so bad." Patient reports burning and pain with urination and "cloudy thin discharge that has a odor." She reports that she puts on a pad d/t the amount of vaginal discharge and endorses changing it "a few times throughout the day." Patient also states she has "pain from constipation." She states she has had "this pain before" and is worried that "she ripped" it with straining. She also reports "white patches" that are tender to the touch on the inner labia minora. She denies new sexual partners. She denies vaginal bleeding and abdominal/pelvic pain.          OB History     Gravida  2   Para  2   Term  2   Preterm  0   AB  0   Living  2      SAB  0   IAB  0   Ectopic  0   Multiple  0   Live Births  2           Past Medical History:  Diagnosis Date   Asthma    as a child-no current medications or treatments   Diabetes mellitus without complication ([place])    Gestational diabetes    History of gestational hypertension 02/04/2021    Past Surgical History:  Procedure Laterality Date   NO PAST SURGERIES      Family History  Problem Relation Age of Onset   Hearing loss Mother    Asthma Neg Hx    Cancer Neg Hx    Diabetes Neg Hx    Stroke Neg Hx     Social History   Tobacco Use   Smoking status: Never    Passive exposure: Never   Smokeless tobacco: Never  Vaping Use   Vaping Use: Never used  Substance Use Topics   Alcohol use: Not Currently   Drug use: Not Currently    Allergies:   Allergies  Allergen Reactions   Peanut-Containing Drug Products Anaphylaxis   Citric Acid Rash    Medications Prior to Admission  Medication Sig Dispense Refill Last Dose   acetaminophen (TYLENOL) 325 MG tablet Take 2 tablets (650 mg total) by mouth every 4 (four) hours as needed (for pain scale < 4). 30 tablet 0 Past Week   doxycycline (ADOXA) 100 MG tablet Take 1 tablet (100 mg total) by mouth 2 (two) times daily. 14 tablet 0    FENUGREEK PO Take 2 capsules by mouth daily.   01/29/2023   Blood Pressure Monitoring (BLOOD PRESSURE KIT) DEVI 1 kit by Does not apply route once a week. 1 each 0    ibuprofen (ADVIL) 600 MG tablet Take 1 tablet (600 mg total) by mouth every 6 (six) hours. 30 tablet 0    Prenatal Vit-Fe Fumarate-FA (PREPLUS) 27-1 MG TABS Take 1 tablet by mouth daily. 30 tablet 13     Review of Systems  Constitutional:  Negative for chills, fatigue and fever.  Eyes:  Negative  for pain and visual disturbance.  Respiratory:  Negative for apnea, shortness of breath and wheezing.   Cardiovascular:  Negative for chest pain and palpitations.  Gastrointestinal:  Negative for abdominal pain, constipation, diarrhea, nausea and vomiting.  Genitourinary:  Positive for dysuria, genital sores, vaginal discharge and vaginal pain. Negative for difficulty urinating, pelvic pain and vaginal bleeding.  Musculoskeletal:  Negative for back pain.  Neurological:  Negative for seizures, weakness and headaches.  Psychiatric/Behavioral:  Negative for suicidal ideas.    Physical Exam   Blood pressure 117/63, pulse (!) 53, temperature 99.1 F (37.3 C), temperature source Oral, resp. rate 16, height 5\' 5"  (1.651 m), weight 77.1 kg, SpO2 96 %, currently breastfeeding.  Physical Exam Vitals and nursing note reviewed. Exam conducted with a chaperone present.  Constitutional:      General: She is not in acute distress.    Appearance: Normal appearance.  HENT:     Head: Normocephalic.  Pulmonary:      Effort: Pulmonary effort is normal.  Genitourinary:    General: Normal vulva.     Pubic Area: No rash.        Comments: Several flat white patches of various sizes with reddened boarders consistent with healing abrasions from delivery. Very tender to touch. White clumpy discharge noted on the inner folds of the labia consistent with yeast.   1 large hemorrhoid noted to upper quadrant of anus. Also noted similar white patches on perineum and rectum.  Musculoskeletal:        General: Normal range of motion.     Cervical back: Normal range of motion.  Skin:    General: Skin is warm and dry.  Neurological:     Mental Status: She is alert and oriented to person, place, and time.  Psychiatric:        Mood and Affect: Mood normal.     MAU Course  Procedures Orders Placed This Encounter  Procedures   Wet prep, genital   Hsv Culture And Typing   Results for orders placed or performed during the hospital encounter of 01/30/23 (from the past 24 hour(s))  Wet prep, genital     Status: Abnormal   Collection Time: 01/30/23  3:49 AM   Specimen: Vaginal  Result Value Ref Range   Yeast Wet Prep HPF POC NONE SEEN NONE SEEN   Trich, Wet Prep NONE SEEN NONE SEEN   Clue Cells Wet Prep HPF POC NONE SEEN NONE SEEN   WBC, Wet Prep HPF POC >=10 (A) <10   Sperm NONE SEEN     MDM - HSV swabs collected on both areas.  - GC pending upon discharge.  - Wet prep normal. White discharge noted in labial folds consistent with yeast.  - Dermaplast spray and tucks ordered for comfort - Patient positive for Chlamydia on 1/5 and denies being treated. Rx for treatment provided in MAU.  - Plan to discharge home with Nystatin cream.   Assessment and Plan   1. Postpartum exam   2. Vaginal yeast infection   3. Chlamydia infection   4. Vaginal lesion   5. Submucosal rectal lesion    - Treatment for Chlamydia provided in MAU.  - Reviewed healing tissue process. Strongly recommended at length to abstain  from sexual intercourse for at minimum 1 week for treatment, and 3-4 weeks for vaginal healing. - Rx for Nystatin cream sent to outpatient pharmacy.  - Worsening signs and return precautions reviewed.  - Patient discharged home in stable condition  and may return to MAU as needed.   Chelsea Doe, MSN CNM  01/30/2023, 4:27 AM

## 2023-01-31 ENCOUNTER — Encounter (HOSPITAL_COMMUNITY): Payer: Self-pay | Admitting: Obstetrics & Gynecology

## 2023-01-31 ENCOUNTER — Inpatient Hospital Stay (HOSPITAL_COMMUNITY): Payer: Medicaid Other

## 2023-01-31 ENCOUNTER — Other Ambulatory Visit: Payer: Self-pay

## 2023-01-31 ENCOUNTER — Inpatient Hospital Stay (HOSPITAL_COMMUNITY)
Admission: AD | Admit: 2023-01-31 | Discharge: 2023-02-01 | Disposition: A | Discharge status: Home / Self Care | Payer: Medicaid Other | Attending: Obstetrics & Gynecology | Admitting: Obstetrics & Gynecology

## 2023-01-31 DIAGNOSIS — Z79899 Other long term (current) drug therapy: Secondary | ICD-10-CM | POA: Diagnosis not present

## 2023-01-31 DIAGNOSIS — O9089 Other complications of the puerperium, not elsewhere classified: Secondary | ICD-10-CM | POA: Diagnosis not present

## 2023-01-31 DIAGNOSIS — R519 Headache, unspecified: Secondary | ICD-10-CM | POA: Insufficient documentation

## 2023-01-31 DIAGNOSIS — Z8759 Personal history of other complications of pregnancy, childbirth and the puerperium: Secondary | ICD-10-CM | POA: Diagnosis not present

## 2023-01-31 MED ORDER — ACETAMINOPHEN-CAFFEINE 500-65 MG PO TABS
2.0000 | ORAL_TABLET | Freq: Once | ORAL | Status: AC
  Administered 2023-01-31: 2 via ORAL
  Filled 2023-01-31: qty 2

## 2023-01-31 MED ORDER — DIPHENHYDRAMINE HCL 50 MG/ML IJ SOLN
25.0000 mg | Freq: Once | INTRAMUSCULAR | Status: AC
  Administered 2023-01-31: 25 mg via INTRAVENOUS
  Filled 2023-01-31: qty 1

## 2023-01-31 MED ORDER — LACTATED RINGERS IV BOLUS
1000.0000 mL | Freq: Once | INTRAVENOUS | Status: AC
  Administered 2023-01-31: 1000 mL via INTRAVENOUS

## 2023-01-31 MED ORDER — OXYCODONE-ACETAMINOPHEN 5-325 MG PO TABS
2.0000 | ORAL_TABLET | Freq: Once | ORAL | Status: AC
  Administered 2023-02-01: 2 via ORAL
  Filled 2023-01-31: qty 2

## 2023-01-31 MED ORDER — LACTATED RINGERS IV BOLUS: 1000.0000 mL | Freq: Once | INTRAVENOUS | Status: DC

## 2023-01-31 MED ORDER — DEXAMETHASONE SODIUM PHOSPHATE 10 MG/ML IJ SOLN
10.0000 mg | Freq: Once | INTRAMUSCULAR | Status: AC
  Administered 2023-02-01: 10 mg via INTRAVENOUS
  Filled 2023-01-31: qty 1

## 2023-01-31 MED ORDER — CYCLOBENZAPRINE HCL 5 MG PO TABS
10.0000 mg | ORAL_TABLET | Freq: Once | ORAL | Status: AC
  Administered 2023-01-31: 10 mg via ORAL
  Filled 2023-01-31: qty 2

## 2023-01-31 NOTE — MAU Provider Note (Incomplete Revision)
History     CSN: [id number]  Arrival date and time: 01/31/23 1701   Event Date/Time   First Provider Initiated Contact with Patient 01/31/23 1741      Chief Complaint  Patient presents with  . Headache   [name] is a 17 y.o. year old G52P2002 female at 4 weeks PP who presents to MAU reporting a H/A that started this morning; rated 10/10. She reports taking Tylenol 500 mg this AM without relief. She has not been able to eat since 2300 last night, but denies N/V. Her pregnancy was complicated by GDMA1 and Chlamydia in pregnancy. She received [facility] with [facility]; next appt is 02/20/2023.   OB History     Gravida  2   Para  2   Term  2   Preterm  0   AB  0   Living  2      SAB  0   IAB  0   Ectopic  0   Multiple  0   Live Births  2           Past Medical History:  Diagnosis Date  . Asthma    as a child-no current medications or treatments  . Diabetes mellitus without complication ([place])   . Gestational diabetes   . History of gestational hypertension 02/04/2021    Past Surgical History:  Procedure Laterality Date  . NO PAST SURGERIES      Family History  Problem Relation Age of Onset  . Hearing loss Mother   . Asthma Neg Hx   . Cancer Neg Hx   . Diabetes Neg Hx   . Stroke Neg Hx     Social History   Tobacco Use  . Smoking status: Never    Passive exposure: Never  . Smokeless tobacco: Never  Vaping Use  . Vaping Use: Never used  Substance Use Topics  . Alcohol use: Not Currently  . Drug use: Not Currently    Allergies:  Allergies  Allergen Reactions  . Peanut-Containing Drug Products Anaphylaxis  . Citric Acid Rash    Medications Prior to Admission  Medication Sig Dispense Refill Last Dose  . acetaminophen (TYLENOL) 325 MG tablet Take 2 tablets (650 mg total) by mouth every 4 (four) hours as needed (for pain scale < 4). 30 tablet 0 01/31/2023  . doxycycline (ADOXA) 100 MG tablet Take 1 tablet (100 mg total) by mouth 2 (two)  times daily. (Patient not taking: Reported on 01/31/2023) 14 tablet 0 Not Taking  . Blood Pressure Monitoring (BLOOD PRESSURE KIT) DEVI 1 kit by Does not apply route once a week. 1 each 0   . FENUGREEK PO Take 2 capsules by mouth daily. (Patient not taking: Reported on 01/31/2023)   Not Taking  . ibuprofen (ADVIL) 600 MG tablet Take 1 tablet (600 mg total) by mouth every 6 (six) hours. 30 tablet 0   . nystatin cream (MYCOSTATIN) Apply to affected area 2 times daily 30 g 0   . Prenatal Vit-Fe Fumarate-FA (PREPLUS) 27-1 MG TABS Take 1 tablet by mouth daily. (Patient not taking: Reported on 01/31/2023) 30 tablet 13 Not Taking    Review of Systems  Constitutional:  Positive for appetite change.  HENT: Negative.    Eyes:  Positive for photophobia.  Respiratory: Negative.    Cardiovascular: Negative.   Gastrointestinal: Negative.   Endocrine: Negative.   Genitourinary:  Positive for vaginal bleeding (normal lochia).  Musculoskeletal: Negative.   Skin: Negative.   Allergic/Immunologic:  Negative.   Neurological:  Positive for headaches.  Hematological: Negative.   Psychiatric/Behavioral: Negative.     Physical Exam   Blood pressure 122/76, pulse (!) 108, temperature 98.6 F (37 C), temperature source Oral, resp. rate 16, SpO2 98 %, currently breastfeeding.  Physical Exam Vitals and nursing note reviewed.  Constitutional:      Appearance: Normal appearance.  HENT:     Head: Normocephalic and atraumatic.  Cardiovascular:     Rate and Rhythm: Tachycardia present.     Heart sounds: Normal heart sounds.  Pulmonary:     Effort: Pulmonary effort is normal.     Breath sounds: Normal breath sounds.  Abdominal:     General: Bowel sounds are normal.     Palpations: Abdomen is soft.  Genitourinary:    Comments: Not indicated Musculoskeletal:        General: Normal range of motion.  Skin:    General: Skin is warm and dry.  Neurological:     Mental Status: She is alert and oriented to person,  place, and time.  Psychiatric:        Mood and Affect: Mood normal.        Behavior: Behavior normal.        Thought Content: Thought content normal.        Judgment: Judgment normal.  Reassessment @ 8527: Patient requesting to order food from cafeteria after receiving medications for H/A Reassessment @ 2030: H/A minimally improved, rated 5/10. Offered additional medication for H/A.  MAU Course  Procedures  MDM Received headache cocktail (LR 1000 mL @ 999 mL/hr with Benadryl 25 mg IVP, Excedrin Tension H/A 2 tablets) -- reports some relief; rating 5/10 after meds Flexeril 10 mg po Regular Diet  - Reassessment at @ 2130. Patient states headache is worse. She reports that her headache is "throbbing" and now she is nauseated. The lights are off in patient room and patient has a washcloth over her forehead and the covers over her head. Patient states she has a history of migraines but states "this is worse".   - Head CT ordered.  - Head CT completed and normal.   Assessment and Plan  Postpartum headache - Information provided on general HA  - Take Tylenol 1000 mg with Ibuprofen 400 mg every 8 hours for H/A - Discharge home - Keep scheduled appointment with [facility] on 02/20/2023 - Patient verbalized an understanding of the plan of care and agrees.   Laury Deep, CNM 01/31/2023, 5:42 PM

## 2023-01-31 NOTE — MAU Note (Signed)
Chelsea Herman is a 17 y.o. here in MAU reporting: headache started this morning. Tried 500mg  tylenol this AM with no relief.   Onset of complaint: today  Pain score: 10/10  Vitals:   01/31/23 1717  BP: 122/76  Pulse: (!) 108  Resp: 16  Temp: 98.6 F (37 C)  SpO2: 98%     FHT:NA  Lab orders placed from triage: none

## 2023-01-31 NOTE — MAU Provider Note (Incomplete)
History     CSN: [id number]  Arrival date and time: 01/31/23 1701   Event Date/Time   First Provider Initiated Contact with Patient 01/31/23 1741      Chief Complaint  Patient presents with  . Headache   [name] is a 17 y.o. year old [id number] female at 4 weeks PP who presents to MAU reporting a H/A that started this morning; rated 10/10. She reports taking Tylenol 500 mg this AM without relief. She has not been able to eat since 2300 last night, but denies N/V. Her pregnancy was complicated by GDMA1 and Chlamydia in pregnancy. She received [facility] with [facility]; next appt is 02/20/2023.   OB History     Gravida  2   Para  2   Term  2   Preterm  0   AB  0   Living  2      SAB  0   IAB  0   Ectopic  0   Multiple  0   Live Births  2           Past Medical History:  Diagnosis Date  . Asthma    as a child-no current medications or treatments  . Diabetes mellitus without complication ([place])   . Gestational diabetes   . History of gestational hypertension 02/04/2021    Past Surgical History:  Procedure Laterality Date  . NO PAST SURGERIES      Family History  Problem Relation Age of Onset  . Hearing loss Mother   . Asthma Neg Hx   . Cancer Neg Hx   . Diabetes Neg Hx   . Stroke Neg Hx     Social History   Tobacco Use  . Smoking status: Never    Passive exposure: Never  . Smokeless tobacco: Never  Vaping Use  . Vaping Use: Never used  Substance Use Topics  . Alcohol use: Not Currently  . Drug use: Not Currently    Allergies:  Allergies  Allergen Reactions  . Peanut-Containing Drug Products Anaphylaxis  . Citric Acid Rash    Medications Prior to Admission  Medication Sig Dispense Refill Last Dose  . acetaminophen (TYLENOL) 325 MG tablet Take 2 tablets (650 mg total) by mouth every 4 (four) hours as needed (for pain scale < 4). 30 tablet 0 01/31/2023  . doxycycline (ADOXA) 100 MG tablet Take 1 tablet (100 mg total) by mouth 2 (two)  times daily. (Patient not taking: Reported on 01/31/2023) 14 tablet 0 Not Taking  . Blood Pressure Monitoring (BLOOD PRESSURE KIT) DEVI 1 kit by Does not apply route once a week. 1 each 0   . FENUGREEK PO Take 2 capsules by mouth daily. (Patient not taking: Reported on 01/31/2023)   Not Taking  . ibuprofen (ADVIL) 600 MG tablet Take 1 tablet (600 mg total) by mouth every 6 (six) hours. 30 tablet 0   . nystatin cream (MYCOSTATIN) Apply to affected area 2 times daily 30 g 0   . Prenatal Vit-Fe Fumarate-FA (PREPLUS) 27-1 MG TABS Take 1 tablet by mouth daily. (Patient not taking: Reported on 01/31/2023) 30 tablet 13 Not Taking    Review of Systems  Constitutional:  Positive for appetite change.  HENT: Negative.    Eyes:  Positive for photophobia.  Respiratory: Negative.    Cardiovascular: Negative.   Gastrointestinal: Negative.   Endocrine: Negative.   Genitourinary:  Positive for vaginal bleeding (normal lochia).  Musculoskeletal: Negative.   Skin: Negative.   Allergic/Immunologic:  Negative.   Neurological:  Positive for headaches.  Hematological: Negative.   Psychiatric/Behavioral: Negative.     Physical Exam   Blood pressure 122/76, pulse (!) 108, temperature 98.6 F (37 C), temperature source Oral, resp. rate 16, SpO2 98 %, currently breastfeeding.  Physical Exam Vitals and nursing note reviewed.  Constitutional:      Appearance: Normal appearance.  HENT:     Head: Normocephalic and atraumatic.  Cardiovascular:     Rate and Rhythm: Tachycardia present.     Heart sounds: Normal heart sounds.  Pulmonary:     Effort: Pulmonary effort is normal.     Breath sounds: Normal breath sounds.  Abdominal:     General: Bowel sounds are normal.     Palpations: Abdomen is soft.  Genitourinary:    Comments: Not indicated Musculoskeletal:        General: Normal range of motion.  Skin:    General: Skin is warm and dry.  Neurological:     Mental Status: She is alert and oriented to person,  place, and time.  Psychiatric:        Mood and Affect: Mood normal.        Behavior: Behavior normal.        Thought Content: Thought content normal.        Judgment: Judgment normal.  Reassessment @ 4696: Patient requesting to order food from cafeteria after receiving medications for H/A Reassessment @ 2030: H/A minimally improved, rated 5/10. Offered additional medication for H/A.  MAU Course  Procedures  MDM Received headache cocktail (LR 1000 mL @ 999 mL/hr with Benadryl 25 mg IVP, Excedrin Tension H/A 2 tablets) -- reports some relief; rating 5/10 after meds Flexeril 10 mg po Regular Diet  - Reassessment at @ 2130. Patient states headache is worse. She reports that her headache is "throbbing" and now she is nauseated. The lights are off in patient room and patient has a washcloth over her forehead and the covers over her head. Patient states she has a history of migraines but states "this is worse".   - Head CT ordered.  - Head CT completed and normal.   - Reassessment at @ 2330. Patient still complains of 10/10 headache. 2nd LR Bolus, patient didn't receive Decadron with early headache cocktail. Decadron ordered with PO Percocet.  Assessment and Plan  Postpartum headache - Information provided on general HA  - Take Tylenol 1000 mg with Ibuprofen 400 mg every 8 hours for H/A - Discharge home - Keep scheduled appointment with [facility] on 02/20/2023 - Patient verbalized an understanding of the plan of care and agrees.   Laury Deep, CNM 01/31/2023, 5:42 PM

## 2023-01-31 NOTE — MAU Note (Signed)
Pt up eating with lights on and TF. Stated headache a little better 5/10.

## 2023-01-31 NOTE — MAU Note (Signed)
Flexeril given about 45 min ago. Pt stated headache felt worse and she could not sit up without feeling dizzy. Notified provider and she talked to pt and ordered head CT.

## 2023-01-31 NOTE — Discharge Instructions (Signed)
Take Tylenol 100 mg and Ibuprofen 400 mg (at the same time) every 8 hours for Headache.

## 2023-01-31 NOTE — MAU Note (Signed)
Pt resting in bed . Stated headache is not much better still 5/10.  Notified provider

## 2023-01-31 NOTE — MAU Provider Note (Cosign Needed)
History     CSN: [id number]  Arrival date and time: 01/31/23 1701   Event Date/Time   First Provider Initiated Contact with Patient 01/31/23 1741      Chief Complaint  Patient presents with   Headache   Ms. Avyonna Wagoner is a 17 y.o. year old [id number] female at 4 weeks PP who presents to MAU reporting a H/A that started this morning; rated 10/10. She reports taking Tylenol 500 mg this AM without relief. She has not been able to eat since 2300 last night, but denies N/V. Her pregnancy was complicated by GDMA1 and Chlamydia in pregnancy. She received [facility] with [facility]; next appt is 02/20/2023.   OB History     Gravida  2   Para  2   Term  2   Preterm  0   AB  0   Living  2      SAB  0   IAB  0   Ectopic  0   Multiple  0   Live Births  2           Past Medical History:  Diagnosis Date   Asthma    as a child-no current medications or treatments   Diabetes mellitus without complication ([place])    Gestational diabetes    History of gestational hypertension 02/04/2021    Past Surgical History:  Procedure Laterality Date   NO PAST SURGERIES      Family History  Problem Relation Age of Onset   Hearing loss Mother    Asthma Neg Hx    Cancer Neg Hx    Diabetes Neg Hx    Stroke Neg Hx     Social History   Tobacco Use   Smoking status: Never    Passive exposure: Never   Smokeless tobacco: Never  Vaping Use   Vaping Use: Never used  Substance Use Topics   Alcohol use: Not Currently   Drug use: Not Currently    Allergies:  Allergies  Allergen Reactions   Peanut-Containing Drug Products Anaphylaxis   Citric Acid Rash    Medications Prior to Admission  Medication Sig Dispense Refill Last Dose   acetaminophen (TYLENOL) 325 MG tablet Take 2 tablets (650 mg total) by mouth every 4 (four) hours as needed (for pain scale < 4). 30 tablet 0 01/31/2023   doxycycline (ADOXA) 100 MG tablet Take 1 tablet (100 mg total) by mouth 2 (two) times daily. (Patient  not taking: Reported on 01/31/2023) 14 tablet 0 Not Taking   Blood Pressure Monitoring (BLOOD PRESSURE KIT) DEVI 1 kit by Does not apply route once a week. 1 each 0    FENUGREEK PO Take 2 capsules by mouth daily. (Patient not taking: Reported on 01/31/2023)   Not Taking   ibuprofen (ADVIL) 600 MG tablet Take 1 tablet (600 mg total) by mouth every 6 (six) hours. 30 tablet 0    nystatin cream (MYCOSTATIN) Apply to affected area 2 times daily 30 g 0    Prenatal Vit-Fe Fumarate-FA (PREPLUS) 27-1 MG TABS Take 1 tablet by mouth daily. (Patient not taking: Reported on 01/31/2023) 30 tablet 13 Not Taking    Review of Systems  Constitutional:  Positive for appetite change.  HENT: Negative.    Eyes:  Positive for photophobia.  Respiratory: Negative.    Cardiovascular: Negative.   Gastrointestinal: Negative.   Endocrine: Negative.   Genitourinary:  Positive for vaginal bleeding (normal lochia).  Musculoskeletal: Negative.   Skin: Negative.   Allergic/Immunologic:  Negative.   Neurological:  Positive for headaches.  Hematological: Negative.   Psychiatric/Behavioral: Negative.     Physical Exam   Blood pressure 122/76, pulse (!) 108, temperature 98.6 F (37 C), temperature source Oral, resp. rate 16, SpO2 98 %, currently breastfeeding.  Physical Exam Vitals and nursing note reviewed.  Constitutional:      Appearance: Normal appearance.  HENT:     Head: Normocephalic and atraumatic.  Cardiovascular:     Rate and Rhythm: Tachycardia present.     Heart sounds: Normal heart sounds.  Pulmonary:     Effort: Pulmonary effort is normal.     Breath sounds: Normal breath sounds.  Abdominal:     General: Bowel sounds are normal.     Palpations: Abdomen is soft.  Genitourinary:    Comments: Not indicated Musculoskeletal:        General: Normal range of motion.  Skin:    General: Skin is warm and dry.  Neurological:     Mental Status: She is alert and oriented to person, place, and time.   Psychiatric:        Mood and Affect: Mood normal.        Behavior: Behavior normal.        Thought Content: Thought content normal.        Judgment: Judgment normal.  Reassessment @ 2831: Patient requesting to order food from cafeteria after receiving medications for H/A Reassessment @ 2030: H/A minimally improved, rated 5/10. Offered additional medication for H/A.  MAU Course  Procedures  MDM Received headache cocktail (LR 1000 mL @ 999 mL/hr with Benadryl 25 mg IVP, Excedrin Tension H/A 2 tablets) -- reports some relief; rating 5/10 after meds Flexeril 10 mg po Regular Diet  - Reassessment at @ 2130. Patient states headache is worse. She reports that her headache is "throbbing" and now she is nauseated. The lights are off in patient room and patient has a washcloth over her forehead and the covers over her head. Patient states she has a history of migraines but states "this is worse".   - Head CT ordered.  - Head CT completed and normal.   - Reassessment at @ 2330. Patient still complains of 10/10 headache. Patient didn't receive Decadron with early headache cocktail. Decadron ordered with PO Percocet. - Headache improving currently 8/10.  - Plan for discharge   Assessment and Plan  Postpartum headache - Information provided on general HA  - Take Tylenol 1000 mg with Ibuprofen 400 mg every 8 hours for H/A - Discharge home - Keep scheduled appointment with [facility] on 02/20/2023 - Patient verbalized an understanding of the plan of care and agrees.   Shantonette Isaias Sakai) Rollene Rotunda, MSN, [place] for [facility]  02/01/23 12:52 AM

## 2023-02-01 LAB — HSV CULTURE AND TYPING

## 2023-02-07 ENCOUNTER — Telehealth: Payer: Self-pay | Admitting: Certified Nurse Midwife

## 2023-02-07 DIAGNOSIS — A6004 Herpesviral vulvovaginitis: Secondary | ICD-10-CM

## 2023-02-07 MED ORDER — VALACYCLOVIR HCL 1 G PO TABS: 1000.0000 mg | ORAL_TABLET | Freq: Two times a day (BID) | ORAL | 0 refills | Status: DC

## 2023-02-07 NOTE — Telephone Encounter (Signed)
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 Phone number invalid. MyChart Message sent.    Isaias Sakai, CNM

## 2023-02-08 ENCOUNTER — Encounter: Payer: Self-pay | Admitting: *Deleted

## 2023-02-08 NOTE — Progress Notes (Signed)
TC. Number not in service. MyChart message sent and indicated urgent in subject line. Requested pt call to update phone number. Test results, RX information, need for partner notification and testing, and education included in message. Letter advising pt of HSV and next steps printed and mailed to pt.

## 2023-02-20 ENCOUNTER — Ambulatory Visit: Payer: Self-pay | Admitting: Obstetrics and Gynecology

## 2023-02-20 ENCOUNTER — Other Ambulatory Visit: Payer: Self-pay

## 2023-08-10 ENCOUNTER — Other Ambulatory Visit: Payer: Self-pay

## 2023-08-10 ENCOUNTER — Encounter (HOSPITAL_COMMUNITY): Payer: Self-pay

## 2023-08-10 ENCOUNTER — Emergency Department (HOSPITAL_COMMUNITY)
Admission: EM | Admit: 2023-08-10 | Discharge: 2023-08-10 | Disposition: A | Discharge status: Home / Self Care | Payer: Medicaid Other | Attending: Emergency Medicine | Admitting: Emergency Medicine

## 2023-08-10 DIAGNOSIS — Z91010 Allergy to peanuts: Secondary | ICD-10-CM | POA: Diagnosis not present

## 2023-08-10 DIAGNOSIS — N939 Abnormal uterine and vaginal bleeding, unspecified: Secondary | ICD-10-CM | POA: Diagnosis present

## 2023-08-10 LAB — WET PREP, GENITAL
Clue Cells Wet Prep HPF POC: NONE SEEN
Sperm: NONE SEEN
Trich, Wet Prep: NONE SEEN
WBC, Wet Prep HPF POC: <10 (ref <10)
Yeast Wet Prep HPF POC: NONE SEEN

## 2023-08-10 LAB — CBC
HCT: 35.2 % — ABNORMAL LOW (ref 36.0–46.0)
HCT: 36.9 % (ref 36.0–46.0)
Hemoglobin: 11.3 g/dL — ABNORMAL LOW (ref 12.0–15.0)
Hemoglobin: 11.7 g/dL — ABNORMAL LOW (ref 12.0–15.0)
MCH: 26.1 pg (ref 26.0–34.0)
MCH: 25.9 pg — ABNORMAL LOW (ref 26.0–34.0)
MCHC: 32.1 g/dL (ref 30.0–36.0)
MCHC: 31.7 g/dL (ref 30.0–36.0)
MCV: 81.3 fL (ref 80.0–100.0)
MCV: 81.6 fL (ref 80.0–100.0)
Platelets: 313 10*3/uL (ref 150–400)
Platelets: 313 10*3/uL (ref 150–400)
RBC: 4.33 MIL/uL (ref 3.87–5.11)
RBC: 4.52 MIL/uL (ref 3.87–5.11)
RDW: 13.6 % (ref 11.5–15.5)
RDW: 13.7 % (ref 11.5–15.5)
WBC: 5.3 10*3/uL (ref 4.0–10.5)
WBC: 5.7 10*3/uL (ref 4.0–10.5)
nRBC: 0 % (ref 0.0–0.2)
nRBC: 0 % (ref 0.0–0.2)

## 2023-08-10 LAB — BASIC METABOLIC PANEL
Anion gap: 11 (ref 5–15)
BUN: 7 mg/dL (ref 6–20)
CO2: 24 mmol/L (ref 22–32)
Calcium: 8.8 mg/dL — ABNORMAL LOW (ref 8.9–10.3)
Chloride: 102 mmol/L (ref 98–111)
Creatinine, Ser: 0.94 mg/dL (ref 0.44–1.00)
GFR, Estimated: >60 mL/min (ref >60)
Glucose, Bld: 116 mg/dL — ABNORMAL HIGH (ref 70–99)
Potassium: 3.5 mmol/L (ref 3.5–5.1)
Sodium: 137 mmol/L (ref 135–145)

## 2023-08-10 LAB — HCG, SERUM, QUALITATIVE: Preg, Serum: NEGATIVE

## 2023-08-10 MED ORDER — ONDANSETRON HCL 4 MG/2ML IJ SOLN
4.0000 mg | Freq: Once | INTRAMUSCULAR | Status: AC
  Administered 2023-08-10: 4 mg via INTRAVENOUS
  Filled 2023-08-10: qty 2

## 2023-08-10 MED ORDER — MEDROXYPROGESTERONE ACETATE 10 MG PO TABS: 10.0000 mg | ORAL_TABLET | Freq: Every day | ORAL | 0 refills | Status: DC

## 2023-08-10 MED ORDER — ESTROGENS CONJUGATED 25 MG IJ SOLR
25.0000 mg | Freq: Once | INTRAMUSCULAR | Status: AC
  Administered 2023-08-10: 25 mg via INTRAVENOUS
  Filled 2023-08-10: qty 25

## 2023-08-10 MED ORDER — STERILE WATER FOR INJECTION IJ SOLN
INTRAMUSCULAR | Status: AC
  Administered 2023-08-10: 5 mL
  Filled 2023-08-10: qty 10

## 2023-08-10 NOTE — ED Provider Notes (Signed)
[place] EMERGENCY DEPARTMENT AT [facility] Provider Note   CSN: [id number] Arrival date & time: 08/10/23  0120     History  Chief Complaint  Patient presents with   Vaginal Bleeding    Chelsea Herman is a 17 y.o. female.  Arrived via EMS. Pt is 6 mos post partum.  Had resumed regular periods a few months ago.  States she started her current cycle early by 10 days & is now on day 10 of vaginal bleeding.  Early this morning began having heavier bleeding & passed several clots. States she has been through multiple pads & used one of her baby's diapers.  Denies vaginal or abdominal pain.  Denies other vaginal d/c.  No vaginal trauma. No hx bleeding d/o.   The history is provided by the patient.  Vaginal Bleeding Associated symptoms: no abdominal pain, no dysuria and no vaginal discharge        Home Medications Prior to Admission medications   Medication Sig Start Date End Date Taking? Authorizing Provider  doxycycline (ADOXA) 100 MG tablet Take 1 tablet (100 mg total) by mouth 2 (two) times daily. Patient not taking: Reported on 01/31/2023 01/05/23   Constant, Peggy, MD  medroxyPROGESTERone (PROVERA) 10 MG tablet Take 1 tablet (10 mg total) by mouth daily for 5 days. 08/10/23 08/15/23 Yes Viviano Simas, NP  acetaminophen (TYLENOL) 325 MG tablet Take 2 tablets (650 mg total) by mouth every 4 (four) hours as needed (for pain scale < 4). 01/03/23   Autry-Lott, Randa Evens, DO  Blood Pressure Monitoring (BLOOD PRESSURE KIT) DEVI 1 kit by Does not apply route once a week. 07/05/22   Adam Phenix, MD  FENUGREEK PO Take 2 capsules by mouth daily. Patient not taking: Reported on 01/31/2023    [provider]  ibuprofen (ADVIL) 600 MG tablet Take 1 tablet (600 mg total) by mouth every 6 (six) hours. 01/03/23   Autry-Lott, Randa Evens, DO  nystatin cream (MYCOSTATIN) Apply to affected area 2 times daily 01/30/23   Carlynn Herald, CNM  Prenatal Vit-Fe Fumarate-FA (PREPLUS) 27-1 MG  TABS Take 1 tablet by mouth daily. Patient not taking: Reported on 01/31/2023 06/04/22   Calvert Cantor, CNM  valACYclovir (VALTREX) 1000 MG tablet Take 1 tablet (1,000 mg total) by mouth 2 (two) times daily. 02/07/23   Carlynn Herald, CNM  albuterol (PROVENTIL HFA;VENTOLIN HFA) 108 (90 Base) MCG/ACT inhaler Inhale 2 puffs into the lungs every 4 (four) hours as needed for wheezing or shortness of breath. 08/31/16 07/06/20  Ree Shay, MD  cetirizine (ZYRTEC) 5 MG tablet Take 1 tablet (5 mg total) by mouth daily. 12/30/18 07/06/20  Elpidio Anis, PA-C      Allergies    Peanut-containing drug products and Citric acid    Review of Systems   Review of Systems  Gastrointestinal:  Negative for abdominal pain.  Genitourinary:  Positive for vaginal bleeding. Negative for dysuria, vaginal discharge and vaginal pain.  All other systems reviewed and are negative.   Physical Exam Updated Vital Signs BP (!) 127/59   Pulse (!) 55   Temp 98.1 F (36.7 C) (Oral)   Resp 18   Ht 5\' 5"  (1.651 m)   Wt 84.4 kg   LMP 08/03/2023 (Approximate)   SpO2 100%   Breastfeeding Yes   BMI 30.95 kg/m  Physical Exam Vitals and nursing note reviewed.  Constitutional:      General: She is not in acute distress.    Appearance: Normal appearance.  HENT:     Head: Normocephalic and atraumatic.     Nose: Nose normal.     Mouth/Throat:     Mouth: Mucous membranes are moist.     Pharynx: Oropharynx is clear.  Eyes:     Conjunctiva/sclera: Conjunctivae normal.  Cardiovascular:     Rate and Rhythm: Normal rate.     Pulses: Normal pulses.  Pulmonary:     Effort: Pulmonary effort is normal.  Abdominal:     General: There is no distension.     Palpations: Abdomen is soft.     Tenderness: There is no abdominal tenderness.  Genitourinary:    Vagina: No tenderness.     Cervix: Cervical bleeding present.     Uterus: Normal. Not tender.      Comments: Parous cervix w/ slit os. Small amount of BRB from os.   Musculoskeletal:     Cervical back: Normal range of motion.  Neurological:     Mental Status: She is alert.     ED Results / Procedures / Treatments   Labs (all labs ordered are listed, but only abnormal results are displayed) Labs Reviewed  CBC - Abnormal; Notable for the following components:      Result Value   Hemoglobin 11.3 (*)    HCT 35.2 (*)    All other components within normal limits  BASIC METABOLIC PANEL - Abnormal; Notable for the following components:   Glucose, Bld 116 (*)    Calcium 8.8 (*)    All other components within normal limits  CBC - Abnormal; Notable for the following components:   Hemoglobin 11.7 (*)    MCH 25.9 (*)    All other components within normal limits  WET PREP, GENITAL  HCG, SERUM, QUALITATIVE  GC/CHLAMYDIA PROBE AMP ([place]) NOT AT [facility]    EKG None  Radiology No results found.  Procedures Procedures    Medications Ordered in ED Medications  conjugated estrogens (PREMARIN) injection 25 mg (25 mg Intravenous Given 08/10/23 0619)  sterile water (preservative free) injection (5 mLs  Given 08/10/23 0619)  ondansetron (ZOFRAN) injection 4 mg (4 mg Intravenous Given 08/10/23 8657)    ED Course/ Medical Decision Making/ A&P                                 Medical Decision Making Amount and/or Complexity of Data Reviewed Labs: ordered.  Risk Prescription drug management.   This patient presents to the ED for concern of vaginal bleeding, this involves an extensive number of treatment options, and is a complaint that carries with it a high risk of complications and morbidity.  The differential diagnosis includes bleeding d/o, cervicitis, AUB, polyps, adenomyosis, PID  Co morbidities that complicate the patient evaluation  none  Additional history obtained from EMS  External records from outside source obtained and reviewed including none available  Lab Tests:  I Ordered, and personally interpreted labs.  The pertinent  results include:  negative pregnancy, hgb 11.7, remainder of CBC &BMP reassuring. Wet prep & gc/chlamydia pending.    Cardiac Monitoring:  The patient was maintained on a cardiac monitor.  I personally viewed and interpreted the cardiac monitored which showed an underlying rhythm of: NSR  Medicines ordered and prescription drug management:  I ordered medication including IV premarin  for bleeding, zofran for nausesa Reevaluation of the patient after these medicines showed that the patient improved I have reviewed the patients home  medicines and have made adjustments as needed  Problem List / ED Course:  16 yof 6mos PP w/ 10 days of BRBPV, increased volume of bleeding & passing clots for several hrs pta.  On exam, well appearing, hemodynamically stable.  No abd TTP.  GU exam as noted above. Mild anemia w/ hgb 11.7.  Pt received IV premarin & zofran.  Will rx course of provera.  Close f/u w/ gyn. Discussed supportive care as well need for f/u w/ PCP in 1-2 days.  Also discussed sx that warrant sooner re-eval in ED. Patient / Family / Caregiver informed of clinical course, understand medical decision-making process, and agree with plan.   Reevaluation:  After the interventions noted above, I reevaluated the patient and found that they have :improved  Social Determinants of Health:  teen, lives w/ family  Dispostion:  After consideration of the diagnostic results and the patients response to treatment, I feel that the patent would benefit from d/c home.         Final Clinical Impression(s) / ED Diagnoses Final diagnoses:  Abnormal uterine bleeding (AUB)    Rx / DC Orders ED Discharge Orders          Ordered    medroxyPROGESTERone (PROVERA) 10 MG tablet  Daily        08/10/23 0633              Viviano Simas, NP 08/11/23 2210    Mesner, Barbara Cower, MD 08/20/23 2308

## 2023-08-10 NOTE — ED Notes (Signed)
Patient reports she's went through 3 pads in the past hour with large clots.  Patient reports bright red bleeding. Charge RN made aware.

## 2023-08-10 NOTE — ED Triage Notes (Addendum)
PER EMS: pt reports heavy vaginal bleeding with clots that started yesterday, approx 2 pads an hr. Denies dizziness. She is 6 months post partum. LMP last month.  BP- 158/90, HR- 69, O2-100% RA, CBG-111

## 2023-08-10 NOTE — Discharge Instructions (Addendum)
Follow up with your gynecologist in the next 1-2 days. Return to ED sooner if bleeding becomes heavy again, or any other concerning symptoms.

## 2023-08-11 LAB — GC/CHLAMYDIA PROBE AMP (~~LOC~~) NOT AT ARMC
Chlamydia: NEGATIVE
Comment: NEGATIVE
Comment: NORMAL
Neisseria Gonorrhea: NEGATIVE

## 2023-08-13 IMAGING — US US OB < 14 WEEKS - US OB TV
1 series · 15 of 28 positions shown · non-contrast
Comparison: None Available.

CLINICAL DATA: Pregnancy of unknown anatomic location. Patient 9
weeks and 3 days pregnant based on her last menstrual period.

EXAM:
OBSTETRIC <14 WK US AND TRANSVAGINAL OB US
TECHNIQUE: Both transabdominal and transvaginal ultrasound examinations were
performed for complete evaluation of the gestation as well as the
maternal uterus, adnexal regions, and pelvic cul-de-sac.
Transvaginal technique was performed to assess early pregnancy.

[Series 1: us ob < 14 weeks - us ob tv · 126 acquisitions, 15 frames shown]
[im 1/126]
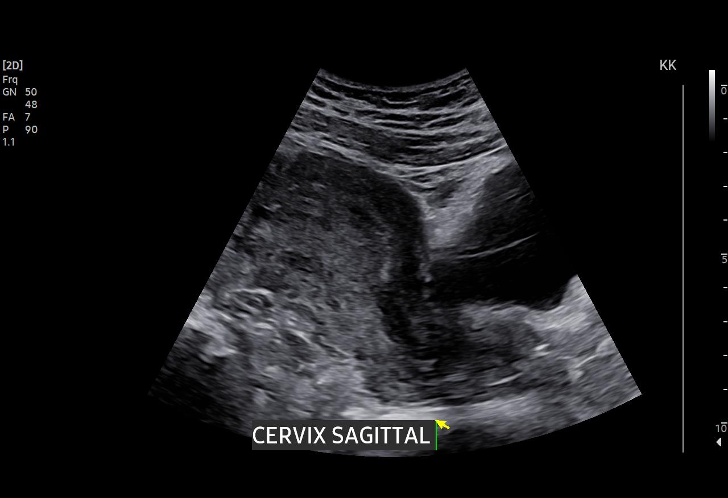
[im 10/126]
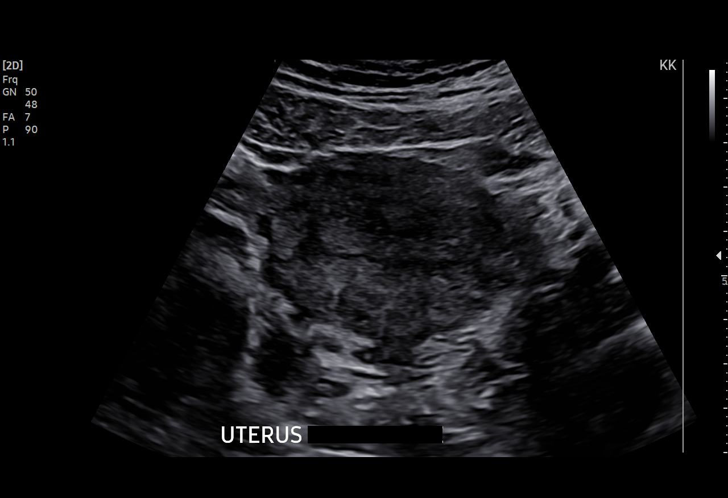
[im 19/126]
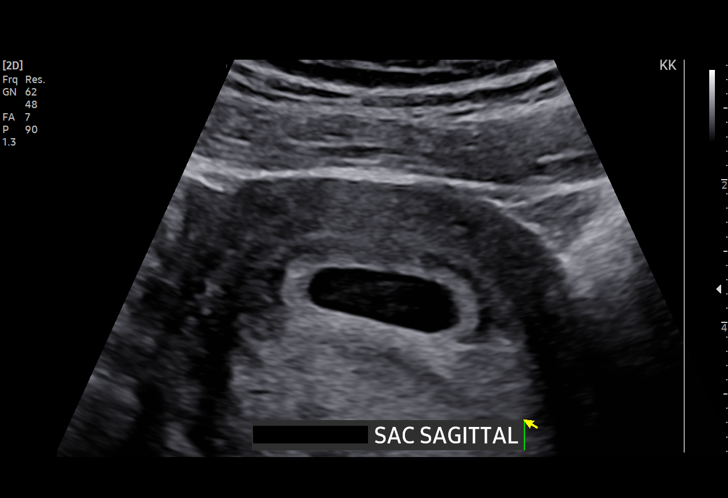
[im 28/126]
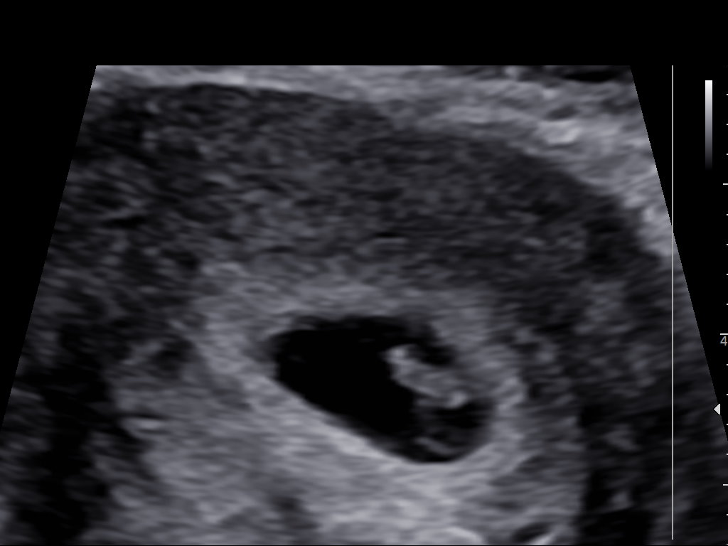
[im 38/126]
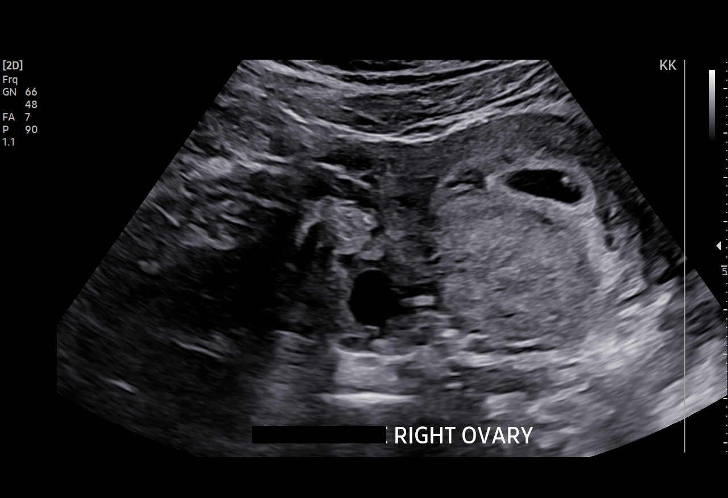
[im 47/126]
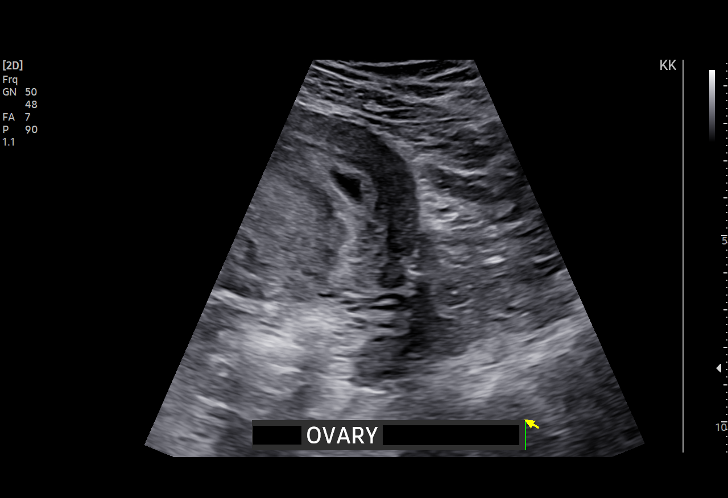
[im 56/126]
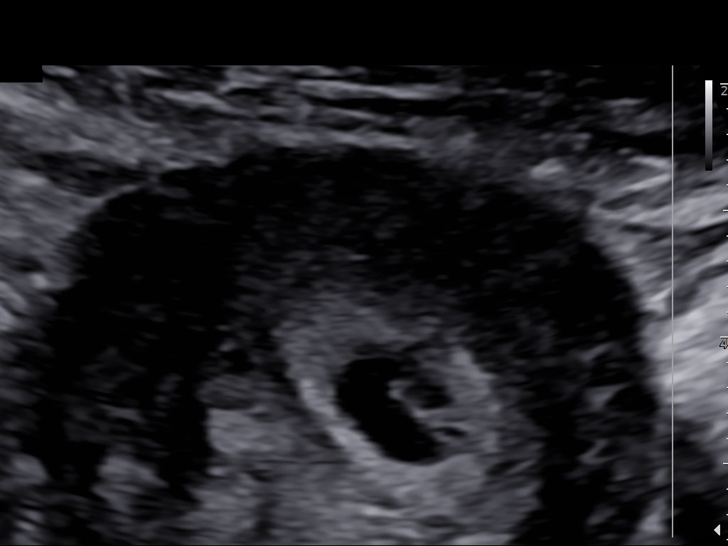
[im 65/126]
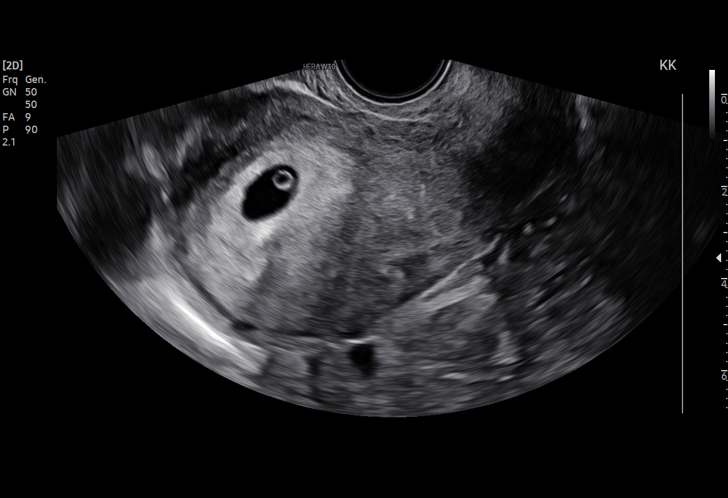
[im 70/126]
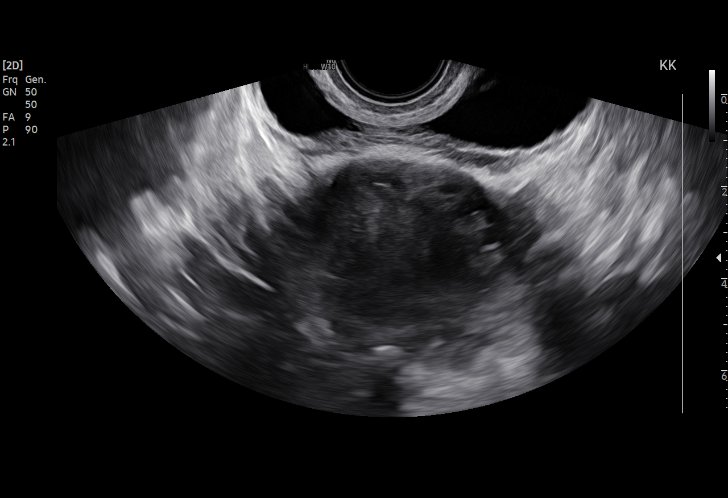
[im 79/126]
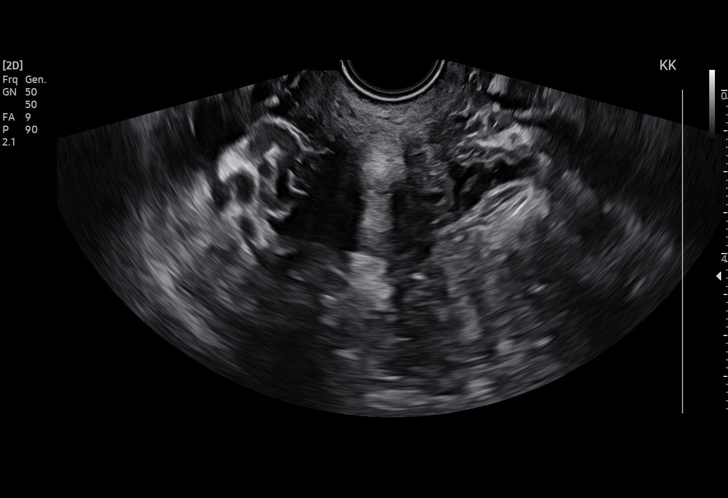
[im 88/126]
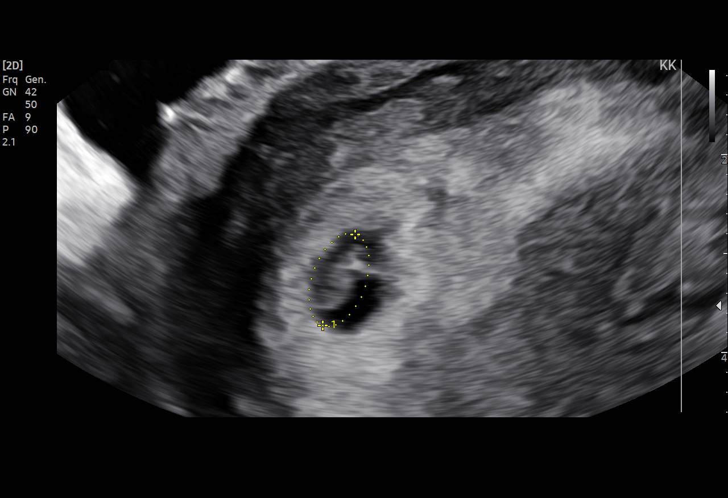
[im 98/126]
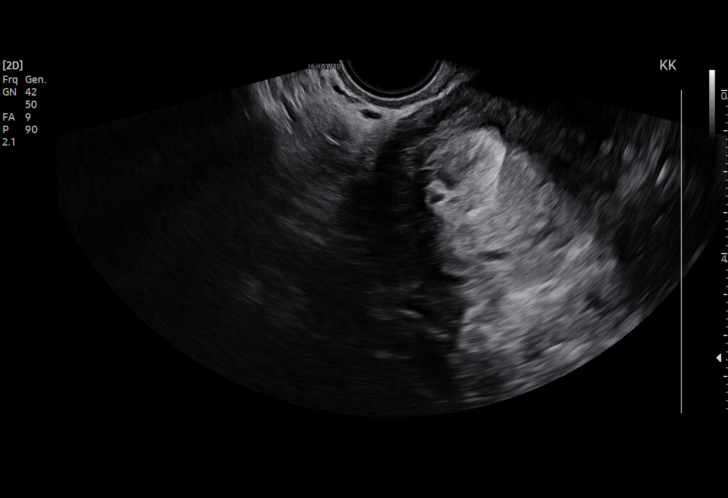
[im 107/126]
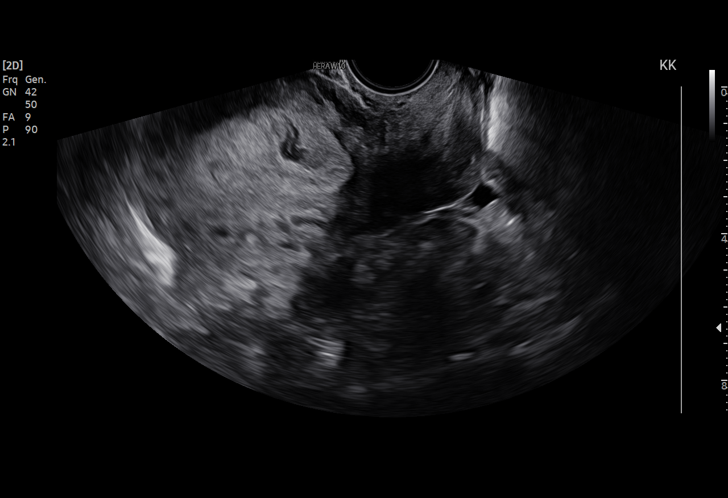
[im 116/126]
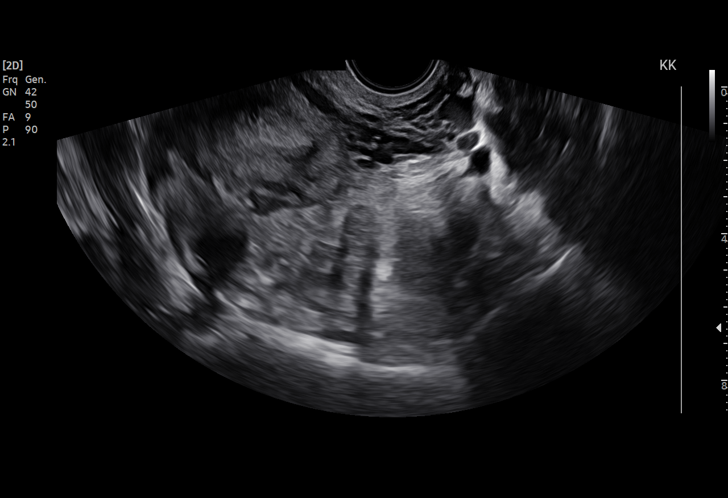
[im 126/126]
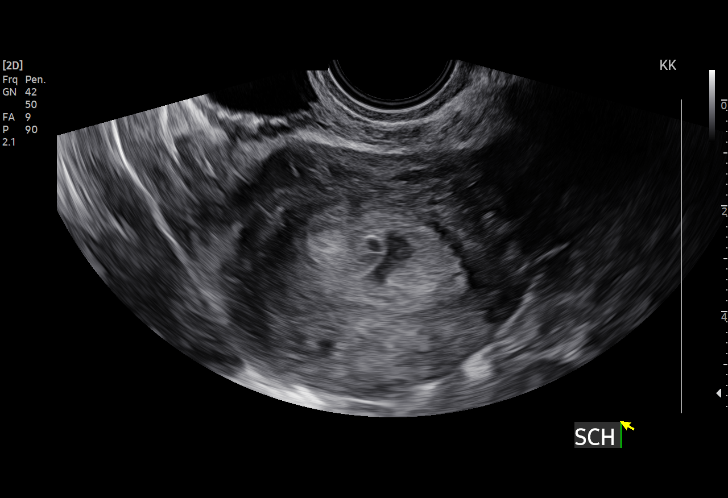

[15 of 28 positions shown; findings below may reference images not displayed]

FINDINGS: Intrauterine gestational sac: Single

Yolk sac:  Visualized.

Embryo:  Visualized.

Cardiac Activity: Visualized.

Heart Rate: 120 bpm

CRL:  8 mm   6 w   5 d                  US EDC: 01/23/2023

Subchorionic hemorrhage:  Small subchronic hemorrhage.

Maternal uterus/adnexae: No uterine masses. Cervix is closed.
Ovaries are unremarkable. No adnexal masses. No abnormal pelvic free
fluid.
IMPRESSION: 1. Single live intrauterine pregnancy with a measured gestational
age of 6 weeks and 5 days.
2. Small subchronic hemorrhage.  No other pregnancy complication.

## 2023-09-04 ENCOUNTER — Other Ambulatory Visit (HOSPITAL_COMMUNITY)
Admission: RE | Admit: 2023-09-04 | Discharge: 2023-09-04 | Disposition: A | Discharge status: Home / Self Care | Payer: Medicaid Other | Source: Ambulatory Visit | Attending: Obstetrics and Gynecology | Admitting: Obstetrics and Gynecology

## 2023-09-04 ENCOUNTER — Ambulatory Visit: Payer: Medicaid Other | Admitting: Obstetrics and Gynecology

## 2023-09-04 VITALS — BP 118/74 | HR 68 | Ht 65.0 in | Wt 186.6 lb

## 2023-09-04 DIAGNOSIS — N760 Acute vaginitis: Secondary | ICD-10-CM | POA: Diagnosis not present

## 2023-09-04 DIAGNOSIS — N939 Abnormal uterine and vaginal bleeding, unspecified: Secondary | ICD-10-CM

## 2023-09-04 DIAGNOSIS — B9689 Other specified bacterial agents as the cause of diseases classified elsewhere: Secondary | ICD-10-CM | POA: Diagnosis not present

## 2023-09-04 NOTE — Progress Notes (Signed)
Pt. Presents for heavy bleeding and clots. Pt. States that it started on Aug. 10th and  Was seen at the ER.

## 2023-09-04 NOTE — Progress Notes (Signed)
   GYNECOLOGY PROGRESS NOTE  History:  17 y.o. [id number] presents to [facility] office today for problem gyn visit. She reports irregular bleeding.  She denies h/a, dizziness, shortness of breath, n/v, or fever/chills.    Had an episode of abnormal bleeding that started ten days prior to normal cycle a couple of weeks ago. She went to ED 8/15. She reports heavy bleeding with golf ball/fist size clots.Reports soaking through diapers and pads. Reports this is first occurrence. 6 mos postpartum, has typical 4-5 day cycle, not super heavy, come once a month around 28-30 days. Worried about future fertility. Had some lab work done in the ED and was given provera.  The following portions of the patient's history were reviewed and updated as appropriate: allergies, current medications, past family history, past medical history, past social history, past surgical history and problem list. Last pap smear n/a <21  Health Maintenance Due  Topic Date Due   COVID-19 Vaccine (1) Never done   FOOT EXAM  Never done   OPHTHALMOLOGY EXAM  Never done   HPV VACCINES (1 - 3-dose series) Never done   HEMOGLOBIN A1C  01/11/2023   Diabetic kidney evaluation - Urine ACR  07/12/2023   INFLUENZA VACCINE  Never done     Review of Systems:  Pertinent items are noted in HPI.   Objective:  Physical Exam Blood pressure 118/74, pulse 68, height 5\' 5"  (1.651 m), weight 186 lb 9.6 oz (84.6 kg), last menstrual period 08/03/2023, currently breastfeeding. VS reviewed, nursing note reviewed,  Constitutional: well developed, well nourished, no distress HEENT: normocephalic Pulm/chest wall: normal effort Abdomen: soft Neuro: alert and oriented  Skin: warm, dry Psych: affect normal Pelvic exam: deferred  Assessment & Plan:  1. Abnormal uterine bleeding ED labs reviewed, hcg neg, cbc normal, gc/ct normal. Discussed acute one time occurrence, would be reasonable to see if this occurs again. Would feel more comfortable with  pelvic u/s. Checking other labs today. Discussed future fertility. Will wait for labs and u/s and follow up and other work up needed.Does not desire birth control.  - [place] PELVIC COMPLETE WITH TRANSVAGINAL; Future - TSH - RPR - HIV Antibody (routine testing w rflx) - Hepatitis C Antibody - Hepatitis B surface antigen - Cervicovaginal ancillary only  Future Appointments  Date Time Provider Department Center  09/07/2023  9:00 AM [facility]    Albertine Grates, FNP

## 2023-09-05 LAB — RPR: RPR Ser Ql: NONREACTIVE

## 2023-09-05 LAB — CERVICOVAGINAL ANCILLARY ONLY
Bacterial Vaginitis (gardnerella): POSITIVE — AB
Candida Glabrata: NEGATIVE
Candida Vaginitis: NEGATIVE
Comment: NEGATIVE
Comment: NEGATIVE
Comment: NEGATIVE
Comment: NEGATIVE
Trichomonas: NEGATIVE

## 2023-09-05 LAB — TSH: TSH: 0.986 u[IU]/mL (ref 0.450–4.500)

## 2023-09-05 LAB — HEPATITIS C ANTIBODY: Hep C Virus Ab: NONREACTIVE

## 2023-09-05 LAB — HEPATITIS B SURFACE ANTIGEN: Hepatitis B Surface Ag: NEGATIVE

## 2023-09-05 LAB — HIV ANTIBODY (ROUTINE TESTING W REFLEX): HIV Screen 4th Generation wRfx: NONREACTIVE

## 2023-09-06 MED ORDER — METRONIDAZOLE 500 MG PO TABS: 500.0000 mg | ORAL_TABLET | Freq: Two times a day (BID) | ORAL | 0 refills | Status: AC

## 2023-09-06 NOTE — Addendum Note (Signed)
Addended by: Sue Lush on: 09/06/2023 08:14 AM   Modules accepted: Orders

## 2023-09-07 ENCOUNTER — Ambulatory Visit (HOSPITAL_BASED_OUTPATIENT_CLINIC_OR_DEPARTMENT_OTHER)
Admission: RE | Admit: 2023-09-07 | Discharge: 2023-09-07 | Disposition: A | Discharge status: Home / Self Care | Payer: Medicaid Other | Source: Ambulatory Visit | Attending: Obstetrics and Gynecology | Admitting: Obstetrics and Gynecology

## 2023-09-07 DIAGNOSIS — N939 Abnormal uterine and vaginal bleeding, unspecified: Secondary | ICD-10-CM | POA: Insufficient documentation

## 2023-10-05 ENCOUNTER — Inpatient Hospital Stay (HOSPITAL_COMMUNITY)
Admission: AD | Admit: 2023-10-05 | Discharge: 2023-10-05 | Disposition: A | Discharge status: Home / Self Care | Payer: Medicaid Other | Attending: Obstetrics & Gynecology | Admitting: Obstetrics & Gynecology

## 2023-10-05 ENCOUNTER — Encounter (HOSPITAL_COMMUNITY): Payer: Self-pay | Admitting: Obstetrics & Gynecology

## 2023-10-05 ENCOUNTER — Inpatient Hospital Stay (HOSPITAL_COMMUNITY): Payer: Medicaid Other

## 2023-10-05 DIAGNOSIS — O3680X0 Pregnancy with inconclusive fetal viability, not applicable or unspecified: Secondary | ICD-10-CM | POA: Insufficient documentation

## 2023-10-05 DIAGNOSIS — O09292 Supervision of pregnancy with other poor reproductive or obstetric history, second trimester: Secondary | ICD-10-CM | POA: Diagnosis not present

## 2023-10-05 DIAGNOSIS — R109 Unspecified abdominal pain: Secondary | ICD-10-CM | POA: Insufficient documentation

## 2023-10-05 DIAGNOSIS — R110 Nausea: Secondary | ICD-10-CM | POA: Diagnosis not present

## 2023-10-05 DIAGNOSIS — Z3A01 Less than 8 weeks gestation of pregnancy: Secondary | ICD-10-CM | POA: Insufficient documentation

## 2023-10-05 DIAGNOSIS — O26891 Other specified pregnancy related conditions, first trimester: Secondary | ICD-10-CM | POA: Insufficient documentation

## 2023-10-05 LAB — WET PREP, GENITAL
Clue Cells Wet Prep HPF POC: NONE SEEN
Sperm: NONE SEEN
Trich, Wet Prep: NONE SEEN
WBC, Wet Prep HPF POC: >=10 — AB (ref <10)
Yeast Wet Prep HPF POC: NONE SEEN

## 2023-10-05 LAB — URINALYSIS, ROUTINE W REFLEX MICROSCOPIC
Bilirubin Urine: NEGATIVE
Glucose, UA: NEGATIVE mg/dL
Hgb urine dipstick: NEGATIVE
Ketones, ur: NEGATIVE mg/dL
Leukocytes,Ua: NEGATIVE
Nitrite: NEGATIVE
Protein, ur: NEGATIVE mg/dL
Specific Gravity, Urine: 1.02 (ref 1.005–1.030)
pH: 7 (ref 5.0–8.0)

## 2023-10-05 LAB — COMPREHENSIVE METABOLIC PANEL
ALT: 20 U/L (ref 0–44)
AST: 19 U/L (ref 15–41)
Albumin: 3.7 g/dL (ref 3.5–5.0)
Alkaline Phosphatase: 87 U/L (ref 38–126)
Anion gap: 7 (ref 5–15)
BUN: 7 mg/dL (ref 6–20)
CO2: 23 mmol/L (ref 22–32)
Calcium: 8.6 mg/dL — ABNORMAL LOW (ref 8.9–10.3)
Chloride: 109 mmol/L (ref 98–111)
Creatinine, Ser: 0.7 mg/dL (ref 0.44–1.00)
GFR, Estimated: >60 mL/min (ref >60)
Glucose, Bld: 107 mg/dL — ABNORMAL HIGH (ref 70–99)
Potassium: 3.8 mmol/L (ref 3.5–5.1)
Sodium: 139 mmol/L (ref 135–145)
Total Bilirubin: 0.4 mg/dL (ref 0.3–1.2)
Total Protein: 6.6 g/dL (ref 6.5–8.1)

## 2023-10-05 LAB — CBC
HCT: 33.5 % — ABNORMAL LOW (ref 36.0–46.0)
Hemoglobin: 10.9 g/dL — ABNORMAL LOW (ref 12.0–15.0)
MCH: 26.3 pg (ref 26.0–34.0)
MCHC: 32.5 g/dL (ref 30.0–36.0)
MCV: 80.7 fL (ref 80.0–100.0)
Platelets: 281 10*3/uL (ref 150–400)
RBC: 4.15 MIL/uL (ref 3.87–5.11)
RDW: 13.3 % (ref 11.5–15.5)
WBC: 5.1 10*3/uL (ref 4.0–10.5)
nRBC: 0 % (ref 0.0–0.2)

## 2023-10-05 LAB — HCG, QUANTITATIVE, PREGNANCY: hCG, Beta Chain, Quant, S: 225 m[IU]/mL — ABNORMAL HIGH (ref <5)

## 2023-10-05 LAB — ABO/RH: ABO/RH(D): A POS

## 2023-10-05 LAB — POCT PREGNANCY, URINE: Preg Test, Ur: POSITIVE — AB

## 2023-10-05 MED ORDER — METOCLOPRAMIDE HCL 10 MG PO TABS: 10.0000 mg | ORAL_TABLET | Freq: Four times a day (QID) | ORAL | 1 refills | Status: DC

## 2023-10-05 NOTE — MAU Provider Note (Signed)
History     CSN: [id number]  Arrival date and time: 10/05/23 1438   Event Date/Time   First Provider Initiated Contact with Patient 10/05/23 1726      Chief Complaint  Patient presents with   Abdominal Pain   Chelsea Herman is a 17 y.o. G3P2002 at [redacted]w[redacted]d by unsure LMP of Sept 12, 2024 who receives care at [facility].  She presents today for abdominal cramping. She states the cramping has been present for about one week ,but she has been "ignoring it."  She states the pain is intermittent and manageable as it doesn't last long when it occurs.  Patient denies current pain and has not identified any relieving or worsening factors. Patient reports some nausea and requests prescription to pharmacy.     OB History     Gravida  3   Para  2   Term  2   Preterm  0   AB  0   Living  2      SAB  0   IAB  0   Ectopic  0   Multiple  0   Live Births  2           Past Medical History:  Diagnosis Date   Asthma    as a child-no current medications or treatments   Diabetes mellitus without complication (HCC)    Gestational diabetes    History of gestational hypertension 02/04/2021    Past Surgical History:  Procedure Laterality Date   NO PAST SURGERIES      Family History  Problem Relation Age of Onset   Hearing loss Mother    Asthma Neg Hx    Cancer Neg Hx    Diabetes Neg Hx    Stroke Neg Hx     Social History   Tobacco Use   Smoking status: Never    Passive exposure: Never   Smokeless tobacco: Never  Vaping Use   Vaping status: Never Used  Substance Use Topics   Alcohol use: Not Currently   Drug use: Not Currently    Allergies:  Allergies  Allergen Reactions   Peanut-Containing Drug Products Anaphylaxis   Citric Acid Rash    Medications Prior to Admission  Medication Sig Dispense Refill Last Dose   doxycycline (ADOXA) 100 MG tablet Take 1 tablet (100 mg total) by mouth 2 (two) times daily. (Patient not taking: Reported on 01/31/2023) 14 tablet  0    acetaminophen (TYLENOL) 325 MG tablet Take 2 tablets (650 mg total) by mouth every 4 (four) hours as needed (for pain scale < 4). 30 tablet 0    Blood Pressure Monitoring (BLOOD PRESSURE KIT) DEVI 1 kit by Does not apply route once a week. (Patient not taking: Reported on 09/04/2023) 1 each 0    FENUGREEK PO Take 2 capsules by mouth daily. (Patient not taking: Reported on 01/31/2023)      ibuprofen (ADVIL) 600 MG tablet Take 1 tablet (600 mg total) by mouth every 6 (six) hours. (Patient not taking: Reported on 09/04/2023) 30 tablet 0    medroxyPROGESTERone (PROVERA) 10 MG tablet Take 1 tablet (10 mg total) by mouth daily for 5 days. 5 tablet 0    nystatin cream (MYCOSTATIN) Apply to affected area 2 times daily 30 g 0    Prenatal Vit-Fe Fumarate-FA (PREPLUS) 27-1 MG TABS Take 1 tablet by mouth daily. (Patient not taking: Reported on 01/31/2023) 30 tablet 13    valACYclovir (VALTREX) 1000 MG tablet Take 1 tablet (1,000  mg total) by mouth 2 (two) times daily. (Patient not taking: Reported on 09/04/2023) 20 tablet 0     Review of Systems  Gastrointestinal:  Positive for abdominal pain (Cramping-None currently) and nausea. Negative for vomiting.  Genitourinary:  Negative for difficulty urinating, dysuria, vaginal bleeding and vaginal discharge.   Physical Exam   Blood pressure 130/78, pulse 86, temperature 98.3 F (36.8 C), resp. rate 18, height 5\' 5"  (1.651 m), weight 85.7 kg, last menstrual period 09/07/2023, currently breastfeeding.  Physical Exam Vitals reviewed.  Constitutional:      Appearance: She is well-developed.  HENT:     Head: Normocephalic and atraumatic.  Eyes:     Conjunctiva/sclera: Conjunctivae normal.  Cardiovascular:     Rate and Rhythm: Normal rate.  Pulmonary:     Effort: Pulmonary effort is normal. No respiratory distress.  Musculoskeletal:        General: Normal range of motion.     Cervical back: Normal range of motion.  Neurological:     Mental Status: She is alert  and oriented to person, place, and time.  Psychiatric:        Mood and Affect: Mood normal.        Behavior: Behavior normal.     MAU Course  Procedures Results for orders placed or performed during the hospital encounter of 10/05/23 (from the past 24 hour(s))  Pregnancy, urine POC     Status: Abnormal   Collection Time: 10/05/23  3:13 PM  Result Value Ref Range   Preg Test, Ur POSITIVE (A) NEGATIVE  Urinalysis, Routine w reflex microscopic -Urine, Clean Catch     Status: None   Collection Time: 10/05/23  3:33 PM  Result Value Ref Range   Color, Urine YELLOW YELLOW   APPearance CLEAR CLEAR   Specific Gravity, Urine 1.020 1.005 - 1.030   pH 7.0 5.0 - 8.0   Glucose, UA NEGATIVE NEGATIVE mg/dL   Hgb urine dipstick NEGATIVE NEGATIVE   Bilirubin Urine NEGATIVE NEGATIVE   Ketones, ur NEGATIVE NEGATIVE mg/dL   Protein, ur NEGATIVE NEGATIVE mg/dL   Nitrite NEGATIVE NEGATIVE   Leukocytes,Ua NEGATIVE NEGATIVE  Wet prep, genital     Status: Abnormal   Collection Time: 10/05/23  3:53 PM   Specimen: PATH Cytology Cervicovaginal Ancillary Only  Result Value Ref Range   Yeast Wet Prep HPF POC NONE SEEN NONE SEEN   Trich, Wet Prep NONE SEEN NONE SEEN   Clue Cells Wet Prep HPF POC NONE SEEN NONE SEEN   WBC, Wet Prep HPF POC >=10 (A) <10   Sperm NONE SEEN   CBC     Status: Abnormal   Collection Time: 10/05/23  3:57 PM  Result Value Ref Range   WBC 5.1 4.0 - 10.5 K/uL   RBC 4.15 3.87 - 5.11 MIL/uL   Hemoglobin 10.9 (L) 12.0 - 15.0 g/dL   HCT 96.0 (L) 45.4 - 09.8 %   MCV 80.7 80.0 - 100.0 fL   MCH 26.3 26.0 - 34.0 pg   MCHC 32.5 30.0 - 36.0 g/dL   RDW 11.9 14.7 - 82.9 %   Platelets 281 150 - 400 K/uL   nRBC 0.0 0.0 - 0.2 %  ABO/Rh     Status: None   Collection Time: 10/05/23  3:57 PM  Result Value Ref Range   ABO/RH(D)      A POS Performed at [facility], [address] [place], [place] [address]   hCG, quantitative, pregnancy  Status: Abnormal   Collection Time:  10/05/23  3:57 PM  Result Value Ref Range   hCG, Beta Chain, Quant, S 225 (H) <5 mIU/mL  Comprehensive metabolic panel     Status: Abnormal   Collection Time: 10/05/23  3:57 PM  Result Value Ref Range   Sodium 139 135 - 145 mmol/L   Potassium 3.8 3.5 - 5.1 mmol/L   Chloride 109 98 - 111 mmol/L   CO2 23 22 - 32 mmol/L   Glucose, Bld 107 (H) 70 - 99 mg/dL   BUN 7 6 - 20 mg/dL   Creatinine, Ser 1.61 0.44 - 1.00 mg/dL   Calcium 8.6 (L) 8.9 - 10.3 mg/dL   Total Protein 6.6 6.5 - 8.1 g/dL   Albumin 3.7 3.5 - 5.0 g/dL   AST 19 15 - 41 U/L   ALT 20 0 - 44 U/L   Alkaline Phosphatase 87 38 - 126 U/L   Total Bilirubin 0.4 0.3 - 1.2 mg/dL   GFR, Estimated >09 >60 mL/min   Anion gap 7 5 - 15   US OB LESS THAN 14 WEEKS WITH OB TRANSVAGINAL  Result Date: 10/05/2023 CLINICAL DATA:  Several week history of abdominal pain. Beta hCG = 225. EXAM: OBSTETRIC <14 WK [place] AND TRANSVAGINAL OB US TECHNIQUE: Both transabdominal and transvaginal ultrasound examinations were performed for complete evaluation of the gestation as well as the maternal uterus, adnexal regions, and pelvic cul-de-sac. Transvaginal technique was performed to assess early pregnancy. COMPARISON:  None Available. FINDINGS: Intrauterine gestational sac: None Yolk sac:  Not Visualized. Embryo:  Not Visualized. Subchorionic hemorrhage:  None visualized. Maternal uterus/adnexae: Uterus measures 8.8 cm in sagittal dimension. Endometrium is thickened, 17 mm. Normal ovaries. Small volume pelvic free fluid. IMPRESSION: Pregnancy of unknown location. Differential may include early intrauterine pregnancy, nonvisualized ectopic pregnancy, or completed spontaneous abortion. Recommend follow up beta-HCG levels and endovaginal ultrasound examination in 7-10 days. Electronically Signed   By: Agustin Cree M.D.   On: 10/05/2023 17:04    MDM Cultures: Wet Prep and GC/CT Labs: UA, UPT, CBC, hCG, ABO Ultrasound Prescription Assessment and Plan  17 year  old G3P2002 at 4 weeks Abdominal Cramping Nausea Pregnancy of Unknown Location  -Orders placed while patient in triage. -Results as above.  -Provider to bedside to discuss. -Informed of need for repeat hCG in 48 hours. -Patient instructed to return to MAU on Saturday around 4pm. -Patient agreeable to antiemetic to be sent to pharmacy for prn usage.  Reglan sent. -Precautions reviewed. -Discharged to home in stable condition.  Cherre Robins 10/05/2023, 5:26 PM

## 2023-10-05 NOTE — MAU Note (Addendum)
.  Chelsea Herman is a 17 y.o. at Unknown here in MAU reporting: abd pain and upset for several weeks. Took a home pregnancy test and it was positive. C/o nausea as well. Denies any vag bleeding or discharge LMP: 09/07/23 Onset of complaint: 2-3 weeks Pain score: 7 Vitals:   10/05/23 1458  BP: 130/78  Pulse: 86  Resp: 18  Temp: 98.3 F (36.8 C)     FHT:n/a Lab orders placed from triage:  upt

## 2023-10-06 LAB — GC/CHLAMYDIA PROBE AMP (~~LOC~~) NOT AT ARMC
Chlamydia: NEGATIVE
Comment: NEGATIVE
Comment: NORMAL
Neisseria Gonorrhea: NEGATIVE

## 2023-10-07 ENCOUNTER — Inpatient Hospital Stay (HOSPITAL_COMMUNITY)
Admission: AD | Admit: 2023-10-07 | Discharge: 2023-10-07 | Disposition: A | Discharge status: Home / Self Care | Payer: Medicaid Other | Attending: Obstetrics and Gynecology | Admitting: Obstetrics and Gynecology

## 2023-10-07 DIAGNOSIS — Z3490 Encounter for supervision of normal pregnancy, unspecified, unspecified trimester: Secondary | ICD-10-CM

## 2023-10-07 DIAGNOSIS — Z3A01 Less than 8 weeks gestation of pregnancy: Secondary | ICD-10-CM | POA: Insufficient documentation

## 2023-10-07 DIAGNOSIS — O3680X0 Pregnancy with inconclusive fetal viability, not applicable or unspecified: Secondary | ICD-10-CM | POA: Insufficient documentation

## 2023-10-07 DIAGNOSIS — O219 Vomiting of pregnancy, unspecified: Secondary | ICD-10-CM | POA: Insufficient documentation

## 2023-10-07 LAB — HCG, QUANTITATIVE, PREGNANCY: hCG, Beta Chain, Quant, S: 832 m[IU]/mL — ABNORMAL HIGH (ref <5)

## 2023-10-07 NOTE — Discharge Instructions (Signed)

## 2023-10-07 NOTE — MAU Note (Signed)
..  Chelsea Herman is a 17 y.o. at [redacted]w[redacted]d here in MAU reporting: here for repeat hcg. Denies VB or abnormal discharge. No pain.   Pain score: 0 Vitals:   10/07/23 1401  BP: 127/71  Pulse: 96  Resp: 14  Temp: 98.3 F (36.8 C)  SpO2: 98%      Lab orders placed from triage:   hcg

## 2023-10-07 NOTE — MAU Provider Note (Signed)
  S [name] is a 17 y.o. [phone] patient who presents to MAU today with complaint of repeat beta Hcg level, early pregnant.   O BP 127/71 (BP Location: Right Arm)   Pulse 96   Temp 98.3 F (36.8 C) (Oral)   Resp 14   LMP 09/07/2023   SpO2 98%  Physical Exam Vitals reviewed.  Constitutional:      Appearance: Normal appearance. She is normal weight.  Pulmonary:     Effort: Pulmonary effort is normal.  Neurological:     Mental Status: She is alert and oriented to person, place, and time.  Psychiatric:        Mood and Affect: Mood normal.        Behavior: Behavior normal.        Thought Content: Thought content normal.        Judgment: Judgment normal.     A Medical screening exam complete 1. Early stage of pregnancy   - Beta Hcg with appropriate rise from 225 to 832 in approximately 48 hours. No bleeding or cramping currently.     2. Vomiting or nausea of pregnancy   - Well controlled with previously prescribed medication.  3. Pregnancy of unknown location - Viability scan scheduled for 10/10/2023  P Discharge from MAU in stable condition. Plans follow up at [facility] 10/10/2023, messaged office to request appointment be changed to viability scan.  Warning signs for worsening condition that would warrant emergency follow-up discussed Patient may return to MAU as needed. Precautions reviewed.    Richardson Landry, CNM 10/07/2023 2:06 PM

## 2023-10-09 ENCOUNTER — Other Ambulatory Visit: Payer: Self-pay

## 2023-10-09 DIAGNOSIS — O3680X0 Pregnancy with inconclusive fetal viability, not applicable or unspecified: Secondary | ICD-10-CM

## 2023-10-10 ENCOUNTER — Other Ambulatory Visit: Payer: Self-pay

## 2023-10-10 DIAGNOSIS — O3680X0 Pregnancy with inconclusive fetal viability, not applicable or unspecified: Secondary | ICD-10-CM

## 2023-10-11 LAB — BETA HCG QUANT (REF LAB): hCG Quant: 2423 m[IU]/mL

## 2023-10-16 ENCOUNTER — Other Ambulatory Visit: Payer: Self-pay

## 2023-10-16 ENCOUNTER — Ambulatory Visit: Payer: Medicaid Other

## 2023-10-16 DIAGNOSIS — Z3A01 Less than 8 weeks gestation of pregnancy: Secondary | ICD-10-CM | POA: Diagnosis not present

## 2023-10-16 DIAGNOSIS — O3680X0 Pregnancy with inconclusive fetal viability, not applicable or unspecified: Secondary | ICD-10-CM | POA: Diagnosis not present

## 2023-10-17 ENCOUNTER — Encounter: Payer: Self-pay | Admitting: Obstetrics & Gynecology

## 2023-10-17 ENCOUNTER — Telehealth: Payer: Medicaid Other

## 2023-10-18 ENCOUNTER — Telehealth: Payer: Self-pay

## 2023-10-18 ENCOUNTER — Other Ambulatory Visit: Payer: Self-pay | Admitting: Obstetrics and Gynecology

## 2023-10-18 DIAGNOSIS — Z3401 Encounter for supervision of normal first pregnancy, first trimester: Secondary | ICD-10-CM

## 2023-10-18 NOTE — Telephone Encounter (Signed)
Chelsea Herman returned call, advised that after consulting with Dr. Jolayne Panther she advised that pregnancy appears to be progressing with the rise in hcg levels however, a follow up u/s will be needed in 10-12 days. Pt voiced understanding, orders placed for u/s

## 2023-10-18 NOTE — Telephone Encounter (Signed)
Called pt to discuss u/s results, no answer; left vm to call office

## 2023-11-01 ENCOUNTER — Other Ambulatory Visit: Payer: Medicaid Other

## 2023-11-01 ENCOUNTER — Ambulatory Visit (HOSPITAL_COMMUNITY)
Admission: RE | Admit: 2023-11-01 | Discharge: 2023-11-01 | Disposition: A | Discharge status: Home / Self Care | Payer: Medicaid Other | Source: Ambulatory Visit | Attending: Obstetrics and Gynecology | Admitting: Obstetrics and Gynecology

## 2023-11-01 DIAGNOSIS — Z3401 Encounter for supervision of normal first pregnancy, first trimester: Secondary | ICD-10-CM | POA: Diagnosis present

## 2023-11-01 DIAGNOSIS — Z3A01 Less than 8 weeks gestation of pregnancy: Secondary | ICD-10-CM | POA: Diagnosis not present

## 2023-11-03 ENCOUNTER — Other Ambulatory Visit: Payer: Self-pay | Admitting: *Deleted

## 2023-11-03 DIAGNOSIS — O3680X0 Pregnancy with inconclusive fetal viability, not applicable or unspecified: Secondary | ICD-10-CM

## 2023-11-13 ENCOUNTER — Encounter: Payer: Self-pay | Admitting: Obstetrics and Gynecology

## 2023-11-13 ENCOUNTER — Encounter: Payer: Self-pay | Admitting: Family Medicine

## 2023-11-13 ENCOUNTER — Ambulatory Visit: Payer: Medicaid Other

## 2023-11-13 ENCOUNTER — Other Ambulatory Visit: Payer: Self-pay

## 2023-11-13 ENCOUNTER — Ambulatory Visit: Payer: Medicaid Other | Admitting: Obstetrics and Gynecology

## 2023-11-13 VITALS — BP 135/86 | HR 78 | Ht 65.0 in | Wt 190.0 lb

## 2023-11-13 DIAGNOSIS — O021 Missed abortion: Secondary | ICD-10-CM | POA: Diagnosis not present

## 2023-11-13 DIAGNOSIS — Z3A00 Weeks of gestation of pregnancy not specified: Secondary | ICD-10-CM | POA: Diagnosis not present

## 2023-11-13 DIAGNOSIS — Z3A09 9 weeks gestation of pregnancy: Secondary | ICD-10-CM | POA: Diagnosis not present

## 2023-11-13 DIAGNOSIS — O3680X0 Pregnancy with inconclusive fetal viability, not applicable or unspecified: Secondary | ICD-10-CM

## 2023-11-13 MED ORDER — OXYCODONE HCL 5 MG PO TABS
5.0000 mg | ORAL_TABLET | ORAL | 0 refills | Status: DC | PRN
Start: 2023-11-13 — End: 2023-11-18

## 2023-11-13 MED ORDER — MISOPROSTOL 200 MCG PO TABS
ORAL_TABLET | ORAL | 1 refills | Status: DC
Start: 2023-11-13 — End: 2023-11-18

## 2023-11-13 MED ORDER — ONDANSETRON 4 MG PO TBDP
4.0000 mg | ORAL_TABLET | Freq: Four times a day (QID) | ORAL | 0 refills | Status: DC | PRN
Start: 2023-11-13 — End: 2023-11-18

## 2023-11-13 NOTE — Progress Notes (Signed)
GYNECOLOGY OFFICE VISIT NOTE  History:   Chelsea Herman is a 17 y.o. [id number] here today for ultrasound for follow up.   Prior US on 11/6 showed CRL 4 mm and yolk sac. No cardiac activity at that time but normal with CRL <71mm.   She was scheduled for f/u US today which now shows empty gestational sac.   After she left upset, her support person and boyfriend informed me she is having some cramping.   Review of Systems:  Pertinent items noted in HPI and remainder of comprehensive ROS otherwise negative.  Physical Exam:  BP 135/86   Pulse 78   Ht 5\' 5"  (1.651 m)   Wt 190 lb (86.2 kg)   LMP 09/07/2023   BMI 31.62 kg/m  CONSTITUTIONAL: Well-developed, well-nourished female in no acute distress.  HEENT:  Normocephalic, atraumatic. External right and left ear normal. No scleral icterus.  NECK: Normal range of motion, supple, no masses noted on observation SKIN: No rash noted. Not diaphoretic. No erythema. No pallor. MUSCULOSKELETAL: Normal range of motion. No edema noted. NEUROLOGIC: Alert and oriented to person, place, and time. Normal muscle tone coordination. No cranial nerve deficit noted. PSYCHIATRIC: Appropriate mood and affect. Normal behavior. Normal judgment and thought content.  Labs and Imaging No results found for this or any previous visit (from the past 168 hour(s)). US OB Transvaginal  Result Date: 11/13/2023 ----------------------------------------------------------------------  OBSTETRICS REPORT                       (Signed Final 11/13/2023 01:48 pm) ---------------------------------------------------------------------- Patient Info  ID #:       [id number]                          D.O.B.:  19-Feb-2006 (16 yrs)  Name:       Chelsea Herman                   Visit Date: 11/13/2023 01:02 pm ---------------------------------------------------------------------- Performed By  Attending:        Milas Hock MD        Ref. Address:     [address]                                                              [place], [place]                                                             [address]  Performed By:     Otilio Jefferson BA       Location[facility]         [facility]                                 [facility] at                                                             [employer] for                                                             Women  Referred By:      [facility]                    for Women ---------------------------------------------------------------------- Orders  #  Description                           Code        Ordered By  1  US OB TRANSVAGINAL                    [phone]     Leroy Libman ----------------------------------------------------------------------  #  Order #                     Accession #                Episode #  1  [id number]                   [id number]                 [id number] ---------------------------------------------------------------------- Indications  [redacted] weeks gestation of pregnancy                 Z3A.09  Pregnancy with inconclusive fetal viability    O36.80X0 ---------------------------------------------------------------------- Fetal Evaluation  Num Of Fetuses:         1  Preg. Location:         Intrauterine  Gest. Sac:              Intrauterine  Yolk Sac:               Not visualized  Fetal Pole:             Not visualized  Fetal Heart Rate(bpm):  0  Cardiac Activity:       No embryo visualized  Comment:    LMP 09/07/2023 ---------------------------------------------------------------------- Biometry  GS:         22  mm     G. Age:  7w 3d                   EDD:   06/28/24 ---------------------------------------------------------------------- Gestational Age  Best:          9w 3d      Det. By:  U/S G S  (10/16/23)      EDD:   06/14/24 ---------------------------------------------------------------------- Cervix Uterus Adnexa  Uterus  Size(cm)     11.4   x    6.4    x  7.3  Uterus  No abnormality visualized.  Right Ovary  Size(cm)       3.5  x   1.9    x  2  Vol(ml): 6.96  Not visualized.  Left Ovary  Size(cm)       3.5  x   1.4    x  2.1       Vol(ml): 5.39  Within normal limits.  Cul De Sac  Small amount of free fluid seen.  Adnexa  No abnormality visualized ---------------------------------------------------------------------- Impression  Gestational sac now empty where previously fetal pole and  yolk sac seen. Findings consistent with unsuccessful  pregnancy. ---------------------------------------------------------------------- Recommendations  Manage as clinically indicated. ----------------------------------------------------------------------                  Milas Hock, MD Electronically Signed Final Report   11/13/2023 01:48 pm ----------------------------------------------------------------------   US OB LESS THAN 14 WEEKS W/ OB TRANSVAGINAL AND DOPPLER  Result Date: 11/03/2023 CLINICAL DATA:  Viability and dating EXAM: OBSTETRIC <14 WK [place] AND TRANSVAGINAL OB US DOPPLER ULTRASOUND OF OVARIES TECHNIQUE: Both transabdominal and transvaginal ultrasound examinations were performed for complete evaluation of the gestation as well as the maternal uterus, adnexal regions, and pelvic cul-de-sac. Transvaginal technique was performed to assess early pregnancy. Color and duplex Doppler ultrasound was utilized to evaluate blood flow to the ovaries. COMPARISON:  Obstetric ultrasound dated 10/16/2023 FINDINGS: Intrauterine gestational sac: Single Yolk sac:  Visualized. Embryo:  Visualized. Cardiac Activity: Not Visualized. CRL:   4.3 mm   6 w 1 d                  [place] EDC: 06/25/2024 Subchorionic hemorrhage:  None visualized. Maternal uterus/adnexae: Normal ovaries. Right ovarian corpus luteum. Trace pelvic free fluid. Pulsed Doppler evaluation of both ovaries demonstrates normal appearing low-resistance arterial and venous waveforms. IMPRESSION: 1. Intrauterine  pregnancy at 6 weeks 1 day gestation of unknown viability. Cardiac activity is not visualized. Recommend follow up beta-HCG levels and ultrasound examination. 2.  No sonographic finding of ovarian torsion. Electronically Signed   By: Agustin Cree M.D.   On: 11/03/2023 10:34   US OB LESS THAN 14 WEEKS WITH OB TRANSVAGINAL  Result Date: 10/18/2023 ----------------------------------------------------------------------  OBSTETRICS REPORT                       (Signed Final 10/18/2023 02:34 pm) ---------------------------------------------------------------------- Patient Info  ID #:       [id number]                          D.O.B.:  [birth year] (16 yrs)  Name:       Chelsea Herman                   Visit Date: 10/16/2023 11:11 am ---------------------------------------------------------------------- Performed By  Attending:        Catalina Antigua MD      Ref. Address:     [address]                                                             [place], [place]  [address]  Performed By:     Otilio Jefferson BA       Location:         [facility]                                 [facility]                                                             [facility] at                                                             [facility]                                                             [facility]  Referred By:      [facility]          Visit Type:       OB                    for Women ---------------------------------------------------------------------- Orders  #  Description                           Code        Ordered By  1  US OB LESS THAN 14 WEEKS              [id number]     Nettie Elm     WITH OB TRANSVAGINAL ----------------------------------------------------------------------  #  Order #                     Accession #                Episode #  1  [id number]                   [id number]                 [id number]  ---------------------------------------------------------------------- Indications  Less than [redacted] weeks gestation of pregnancy       Z3A.01  Encounter for uncertain dates                  Z36.87 ---------------------------------------------------------------------- Fetal Evaluation  Num Of Fetuses:         1  Preg. Location:         Intrauterine  Gest. Sac:              Probable early IUGS  Yolk Sac:               Not visualized  Fetal Pole:  Not visualized  Fetal Heart Rate(bpm):  0  Cardiac Activity:       No embryo visualized  Comment:    LMP 09/07/23 ---------------------------------------------------------------------- Biometry  GS:        8.4  mm     G. Age:  5w 3d                   EDD:   06/14/24 ---------------------------------------------------------------------- Gestational Age  Best:          5w 3d      Det. By:  U/S G S (10/16/23)       EDD:   06/14/24 ---------------------------------------------------------------------- Cervix Uterus Adnexa  Uterus  Size(cm)     10.4   x   5.8    x  7.1  Uterus  No abnormality visualized.  Right Ovary  Size(cm)       2.8  x   1.8    x  1.7       Vol(ml): 4.49  Within normal limits.  Left Ovary  Size(cm)       3.4  x   1.4    x  2.4       Vol(ml): 5.98  Within normal limits.  Cul De Sac  Small amount of free fluid seen.  Adnexa  No abnormality visualized ---------------------------------------------------------------------- Impression  Intrauterine gestational sac ---------------------------------------------------------------------- Recommendations  Repeat ultrasound in 10 days to confirm viability ----------------------------------------------------------------------                 Catalina Antigua, MD Electronically Signed Final Report   10/18/2023 02:34 pm ----------------------------------------------------------------------    Assessment and Plan:   1. Missed abortion - Repeat US shows empty gestational sac. Informed patient of miscarriage and she was  appropriately upset but left before we could discuss options available.  - She then returned to the office willing to discuss options.  - Reviewed causes of SAB.  - Discussed options: expectant management, misoprostol and D&E. Discussed the risks and benefits to each.   - We discussed dosing and process of miso: Discussed normal bleeding and cramping with the medication. Discussed pain medication often times needed when medically induced for missed abortion. Discussed success rate is depending on gestational age at the time of miscarriage  - We discussed genetics: we reviewed the benefits - she Accepts - Risks of surgery include but are not limited to: bleeding, infection, injury to surrounding organs/tissues (i.e. bowel/bladder/ureters), need for additional procedures, wound complications, hospital re-admission, and conversion to open surgery - We discussed postop restrictions, precautions and expectations. We discussed typical hospital course and stay.  - She is RH positive - She would like: Medical therapy but if it doesn't work, she would like D&E scheduled for later this week. Discussed if medical therapy and desires genetics, may collect pregnancy tissue and bring to our office as well. Message sent to scheduler for D&E.   Return in about 4 weeks (around 12/11/2023) for SAB follow up - goes to [facility].  Milas Hock, MD, FACOG Obstetrician & Gynecologist, [facility] for [facility], [facility]

## 2023-11-14 ENCOUNTER — Telehealth: Payer: Self-pay | Admitting: Obstetrics & Gynecology

## 2023-11-15 NOTE — Progress Notes (Signed)
Left a detailed message for patient about tomorrows procedure when and where what time to be her medications she is able to take and grooming instructions

## 2023-11-15 NOTE — Progress Notes (Signed)
PCP -  Cardiologist -   PPM/ICD -  Device Orders -  Rep Notified -   Chest x-ray -  EKG -  Stress Test -  ECHO -  Cardiac Cath -   CPAP -   GLP-1 -  Fasting Blood Sugar -  Checks Blood Sugar /day  Blood Thinner Instructions:  Aspirin Instructions:   ERAS Protcol -   COVID TEST-   Anesthesia review:   Patient verbally denies any shortness of breath, fever, cough and chest pain during phone call Message left for patient instructions for tomorrow's procedure  -------------  SDW INSTRUCTIONS given:  Your procedure is scheduled on November 16, 2023.  Report to [facility] Main Entrance "A" 9:00  A.M., and check in at the Admitting office.  Call this number if you have problems the morning of surgery:  [phone]   Remember:  Do not eat after midnight the night before your surgery  You may drink clear liquids until 8:30 the morning of your surgery.   Clear liquids allowed are: Water, Non-Citrus Juices (without pulp), Carbonated Beverages, Clear Tea, Black Coffee Only, and Gatorade    Take these medicines the morning of surgery with A SIP OF WATER   As of today, STOP taking any Aspirin (unless otherwise instructed by your surgeon) Aleve, Naproxen, Ibuprofen, Motrin, Advil, Goody's, BC's, all herbal medications, fish oil, and all vitamins.                      Do not wear jewelry, make up, or nail polish            Do not wear lotions, powders, perfumes/colognes, or deodorant.            Do not shave 48 hours prior to surgery.  Men may shave face and neck.            Do not bring valuables to the hospital.            [facility] is not responsible for any belongings or valuables.  Do NOT Smoke (Tobacco/Vaping) 24 hours prior to your procedure If you use a CPAP at night, you may bring all equipment for your overnight stay.   Contacts, glasses, dentures or bridgework may not be worn into surgery.      For patients admitted to the hospital, discharge time will be  determined by your treatment team.   Patients discharged the day of surgery will not be allowed to drive home, and someone needs to stay with them for 24 hours.    Special instructions:   [facility]- Preparing For Surgery  Before surgery, you can play an important role. Because skin is not sterile, your skin needs to be as free of germs as possible. You can reduce the number of germs on your skin by washing with CHG (chlorahexidine gluconate) Soap before surgery.  CHG is an antiseptic cleaner which kills germs and bonds with the skin to continue killing germs even after washing.    Oral Hygiene is also important to reduce your risk of infection.  Remember - BRUSH YOUR TEETH THE MORNING OF SURGERY WITH YOUR REGULAR TOOTHPASTE  Please do not use if you have an allergy to CHG or antibacterial soaps. If your skin becomes reddened/irritated stop using the CHG.  Do not shave (including legs and underarms) for at least 48 hours prior to first CHG shower. It is OK to shave your face.  Please follow these instructions carefully.  Shower the NIGHT BEFORE SURGERY and the MORNING OF SURGERY with DIAL Soap.   Pat yourself dry with a CLEAN TOWEL.  Wear CLEAN PAJAMAS to bed the night before surgery  Place CLEAN SHEETS on your bed the night of your first shower and DO NOT SLEEP WITH PETS.   Day of Surgery: Please shower morning of surgery  Wear Clean/Comfortable clothing the morning of surgery Do not apply any deodorants/lotions.   Remember to brush your teeth WITH YOUR REGULAR TOOTHPASTE.   Questions were answered. Patient verbalized understanding of instructions.

## 2023-11-16 ENCOUNTER — Encounter (HOSPITAL_COMMUNITY)
Admission: AD | Disposition: A | Discharge status: Home / Self Care | Payer: Self-pay | Source: Home / Self Care | Attending: Obstetrics and Gynecology

## 2023-11-16 ENCOUNTER — Other Ambulatory Visit: Payer: Self-pay

## 2023-11-16 ENCOUNTER — Ambulatory Visit (HOSPITAL_COMMUNITY)
Admission: RE | Admit: 2023-11-16 | Payer: Medicaid Other | Source: Home / Self Care | Admitting: Obstetrics and Gynecology

## 2023-11-16 ENCOUNTER — Inpatient Hospital Stay (HOSPITAL_COMMUNITY): Payer: Medicaid Other

## 2023-11-16 ENCOUNTER — Inpatient Hospital Stay (HOSPITAL_COMMUNITY)
Admission: AD | Admit: 2023-11-16 | Discharge: 2023-11-16 | Disposition: A | Discharge status: Home / Self Care | Payer: Medicaid Other | Attending: Obstetrics and Gynecology | Admitting: Obstetrics and Gynecology

## 2023-11-16 DIAGNOSIS — Z3A10 10 weeks gestation of pregnancy: Secondary | ICD-10-CM

## 2023-11-16 DIAGNOSIS — O039 Complete or unspecified spontaneous abortion without complication: Secondary | ICD-10-CM | POA: Insufficient documentation

## 2023-11-16 DIAGNOSIS — K5900 Constipation, unspecified: Secondary | ICD-10-CM | POA: Diagnosis not present

## 2023-11-16 DIAGNOSIS — O021 Missed abortion: Secondary | ICD-10-CM

## 2023-11-16 SURGERY — DILATION AND EVACUATION, UTERUS
Anesthesia: Choice

## 2023-11-16 MED ORDER — TRAMADOL HCL 50 MG PO TABS: 50.0000 mg | ORAL_TABLET | Freq: Four times a day (QID) | ORAL | 0 refills | Status: DC | PRN

## 2023-11-16 MED ORDER — TRAMADOL HCL 50 MG PO TABS
50.0000 mg | ORAL_TABLET | Freq: Once | ORAL | Status: AC
  Administered 2023-11-16: 50 mg via ORAL
  Filled 2023-11-16: qty 1

## 2023-11-16 MED ORDER — POLYETHYLENE GLYCOL 3350 17 GM/SCOOP PO POWD: 17.0000 g | Freq: Every day | ORAL | 1 refills | Status: DC | PRN

## 2023-11-16 NOTE — Progress Notes (Signed)
This RN called Short Stay, informed pt's surgery had been cancelled.

## 2023-11-16 NOTE — MAU Provider Note (Signed)
History     CSN: [id number]  Arrival date and time: 11/16/23 1051   None     No chief complaint on file.  HPI Patient presenting for evaluation for vaginal bleeding after miscarriage.  Patient was diagnosed with a miscarriage and treated and received Cytotec on 11/18.  She was also scheduled for D&E today but called and stated that there was a large amount of products passed so they canceled the procedure.  She was unsure if she still needed the procedure or if she passed all of the products because she was still having significant abdominal pain so she wanted evaluation.  Reports she is passing occasional small clots but having considerable pelvic pain and cramping.  She is also complaining of considerable constipation from the pain medication she received.  Reports she has not had a bowel movement in 4 days.  OB History     Gravida  3   Para  2   Term  2   Preterm  0   AB  0   Living  2      SAB  0   IAB  0   Ectopic  0   Multiple  0   Live Births  2           Past Medical History:  Diagnosis Date   Asthma    as a child-no current medications or treatments   Diabetes mellitus without complication (HCC)    Gestational diabetes    History of gestational hypertension 02/04/2021    Past Surgical History:  Procedure Laterality Date   NO PAST SURGERIES      Family History  Problem Relation Age of Onset   Hearing loss Mother    Asthma Neg Hx    Cancer Neg Hx    Diabetes Neg Hx    Stroke Neg Hx     Social History   Tobacco Use   Smoking status: Never    Passive exposure: Never   Smokeless tobacco: Never  Vaping Use   Vaping status: Never Used  Substance Use Topics   Alcohol use: Not Currently   Drug use: Not Currently    Allergies:  Allergies  Allergen Reactions   Peanut-Containing Drug Products Anaphylaxis   Citric Acid Rash    Medications Prior to Admission  Medication Sig Dispense Refill Last Dose   acetaminophen (TYLENOL) 325  MG tablet Take 2 tablets (650 mg total) by mouth every 4 (four) hours as needed (for pain scale < 4). (Patient not taking: Reported on 11/13/2023) 30 tablet 0    metoCLOPramide (REGLAN) 10 MG tablet Take 1 tablet (10 mg total) by mouth every 6 (six) hours. (Patient not taking: Reported on 11/13/2023) 30 tablet 1    misoprostol (CYTOTEC) 200 MCG tablet Place four tablets in between your gums and cheeks (two tablets on each side) as instructed OR insert four tablets vaginally. May repeat in 4-6 hours if no miscarriage begins 4 tablet 1    ondansetron (ZOFRAN-ODT) 4 MG disintegrating tablet Take 1 tablet (4 mg total) by mouth every 6 (six) hours as needed for nausea. 20 tablet 0    oxyCODONE (OXY IR/ROXICODONE) 5 MG immediate release tablet Take 1 tablet (5 mg total) by mouth every 4 (four) hours as needed for severe pain (pain score 7-10) or breakthrough pain. 5 tablet 0    Prenatal Vit-Fe Fumarate-FA (PREPLUS) 27-1 MG TABS Take 1 tablet by mouth daily. 30 tablet 13    valACYclovir (VALTREX) 1000 MG  tablet Take 1 tablet (1,000 mg total) by mouth 2 (two) times daily. (Patient not taking: Reported on 09/04/2023) 20 tablet 0     Review of Systems  Constitutional:  Negative for chills and fever.  Gastrointestinal:  Positive for abdominal pain and constipation.  Genitourinary:  Positive for vaginal bleeding. Negative for vaginal discharge.   Physical Exam   Blood pressure 123/74, pulse 82, temperature 98 F (36.7 C), temperature source Oral, resp. rate 18, height 5\' 5"  (1.651 m), weight 87.5 kg, last menstrual period 09/07/2023, SpO2 100%, currently breastfeeding.  Physical Exam Vitals reviewed.  Constitutional:      Appearance: Normal appearance.  HENT:     Head: Normocephalic.     Nose: Nose normal.     Mouth/Throat:     Mouth: Mucous membranes are moist.  Eyes:     Extraocular Movements: Extraocular movements intact.  Cardiovascular:     Rate and Rhythm: Normal rate.     Pulses: Normal  pulses.  Pulmonary:     Effort: Pulmonary effort is normal.  Abdominal:     Palpations: Abdomen is soft.     Tenderness: There is abdominal tenderness (Suprapubic).  Musculoskeletal:        General: Normal range of motion.     Cervical back: Normal range of motion.  Skin:    General: Skin is warm.     Capillary Refill: Capillary refill takes less than 2 seconds.  Neurological:     General: No focal deficit present.     Mental Status: She is alert.  Psychiatric:        Mood and Affect: Mood normal.     MAU Course  Procedures  MDM Ultrasound  Assessment and Plan  Chelsea Herman is an 17 yo presenting for vaginal bleeding after miscarriage.  Vaginal bleeding after miscarriage Patient diagnosed with miscarriage and had Cytotec given to her on 11/18.  Feels like she passed a lot of products of conception but is unsure if she passed small.  Was scheduled for D&C today but did not have it done.  Ultrasound showing previously noted gestational sac no longer seen.  Endometrium 18 mm without focal hypervascularity.  Discussed continued supportive care and patient is agreeable.  Sent patient prescription for all TRAM as well as MiraLAX for constipation.  Discussed strict return precautions and patient is agreeable.  Patient discharged home.  Celedonio Savage 11/16/2023, 1:31 PM

## 2023-11-16 NOTE — MAU Note (Signed)
Chelsea Herman is a 17 y.o. at [redacted]w[redacted]d here in MAU reporting: she's scheduled for surgery today because she hasn't passed all of the pregnancy.  Reports she took medication to pass pregnancy, but unsure if everything has passed.  Reports she continues to have abdominal pain and is constipated, no BM in 4 days.  Reports she is having moderate VB and passing small clots.   LMP: NA Onset of complaint: on going Pain score: 9 Vitals:   11/16/23 1129  BP: 123/74  Pulse: 82  Resp: 18  Temp: 98 F (36.7 C)  SpO2: 100%     FHT:NA Lab orders placed from triage:   None

## 2023-11-18 ENCOUNTER — Inpatient Hospital Stay (HOSPITAL_COMMUNITY)
Admission: AD | Admit: 2023-11-18 | Discharge: 2023-11-18 | Disposition: A | Discharge status: Home / Self Care | Payer: Medicaid Other | Attending: Obstetrics and Gynecology | Admitting: Obstetrics and Gynecology

## 2023-11-18 ENCOUNTER — Encounter (HOSPITAL_COMMUNITY): Payer: Self-pay | Admitting: Obstetrics and Gynecology

## 2023-11-18 DIAGNOSIS — K5903 Drug induced constipation: Secondary | ICD-10-CM | POA: Diagnosis not present

## 2023-11-18 DIAGNOSIS — O039 Complete or unspecified spontaneous abortion without complication: Secondary | ICD-10-CM | POA: Diagnosis present

## 2023-11-18 DIAGNOSIS — Z3A10 10 weeks gestation of pregnancy: Secondary | ICD-10-CM | POA: Diagnosis not present

## 2023-11-18 LAB — CBC
HCT: 33 % — ABNORMAL LOW (ref 36.0–46.0)
Hemoglobin: 10.6 g/dL — ABNORMAL LOW (ref 12.0–15.0)
MCH: 25.5 pg — ABNORMAL LOW (ref 26.0–34.0)
MCHC: 32.1 g/dL (ref 30.0–36.0)
MCV: 79.5 fL — ABNORMAL LOW (ref 80.0–100.0)
Platelets: 303 10*3/uL (ref 150–400)
RBC: 4.15 MIL/uL (ref 3.87–5.11)
RDW: 13.9 % (ref 11.5–15.5)
WBC: 4.6 10*3/uL (ref 4.0–10.5)
nRBC: 0 % (ref 0.0–0.2)

## 2023-11-18 MED ORDER — SMOG ENEMA
960.0000 mL | Freq: Once | RECTAL | Status: AC
  Administered 2023-11-18: 960 mL via RECTAL
  Filled 2023-11-18: qty 960

## 2023-11-18 MED ORDER — TRAMADOL HCL 50 MG PO TABS: 50.0000 mg | ORAL_TABLET | Freq: Two times a day (BID) | ORAL | 0 refills | Status: AC | PRN

## 2023-11-18 NOTE — Discharge Instructions (Signed)
You have constipation which is hard stools that are difficult to pass. It is important to have regular bowel movements every 1-3 days that are soft and easy to pass. Hard stools increase your risk of hemorrhoids and are very uncomfortable.   To prevent constipation you can increase the amount of fiber in your diet. Examples of foods with fiber are leafy greens, whole grain breads, oatmeal and other grains.  It is also important to drink at least 64 ounces of water everyday.   If you have not had a bowel movement in 4-5 days, you made need to clean out your bowel.  This will help you establish normal movements through your bowel.    Miralax Clean out Take 8 capfuls of miralax in 64 oz of gatorade. You can use any fluid that appeals to you (gatorade, water, juice) Continue to drink at least 64 ounces of water throughout the day You can repeat with another 8 capfuls of miralax in 64 oz of gatorade if you are not having a large amount of stools You will need to be at home and close to a bathroom for about 8 hours when you do the above as you may need to go to the bathroom frequently.   After you are cleaned out: - Start Colace100mg  twice daily - Start Miralax once daily - Start a daily fiber supplement like metamucil or citrucel - You can safely use enemas in pregnancy  - if you are having diarrhea you can reduce to Colace once a day or miralax every other day or a 1/2 capful daily.

## 2023-11-18 NOTE — MAU Note (Signed)
...  Chelsea Herman is a 17 y.o. at [redacted]w[redacted]d here in MAU reporting: Post miscarriage. Dx with miscarriage on 11/18. She reports she passed a large blood clot at home and has been experiencing an increase in vaginal bleeding as well as lower abdominal pain. She reports her meds are not working and she feels as if she is experiencing contractions. Reports blood is bright red. Reports feeling light headed. Brought straight back to room. Last took a laxative three days ago.   Has not had a bowel movement in one week.   Last doses: Tylenol extra strength 1530 1 Tramadol 1617   Onset of complaint: today Pain score: 10/10 lower abdomen  Lab orders placed from triage: none

## 2023-11-18 NOTE — MAU Provider Note (Signed)
History     CSN: [id number]  Arrival date and time: 11/18/23 1712   Event Date/Time   First Provider Initiated Contact with Patient 11/18/23 1803      Chief Complaint  Patient presents with   Vaginal Bleeding   Abdominal Pain   Constipation   HPI Karitza Holyfield is a 17 y.o. [phone] at [redacted]w[redacted]d who presents with abdominal pain, vaginal bleeding, & constipation.  Treated for miscarriage last week. Ultrasound 2 days ago confirmed completed miscarriage & no evidence of RPOCs. Reports continued bleeding since then & passing some blood clots. Also reports feeling lightheaded at times. Takes 2-3 hours to fill up a pad.  Has had constipation since receiving pain medication last week for her miscarriage. States she is taking tramadol every 6 hours. Has been taking miralax everyday without relief of symptoms. Last BM was a week ago. Denies fever/chills, nausea or vomiting.   OB History     Gravida  3   Para  2   Term  2   Preterm  0   AB  0   Living  2      SAB  0   IAB  0   Ectopic  0   Multiple  0   Live Births  2           Past Medical History:  Diagnosis Date   Asthma    as a child-no current medications or treatments   Diabetes mellitus without complication (HCC)    Gestational diabetes    History of gestational hypertension 02/04/2021    Past Surgical History:  Procedure Laterality Date   NO PAST SURGERIES      Family History  Problem Relation Age of Onset   Hearing loss Mother    Asthma Neg Hx    Cancer Neg Hx    Diabetes Neg Hx    Stroke Neg Hx     Social History   Tobacco Use   Smoking status: Never    Passive exposure: Never   Smokeless tobacco: Never  Vaping Use   Vaping status: Never Used  Substance Use Topics   Alcohol use: Not Currently   Drug use: Not Currently    Allergies:  Allergies  Allergen Reactions   Peanut-Containing Drug Products Anaphylaxis   Citric Acid Rash    Medications Prior to Admission  Medication Sig  Dispense Refill Last Dose   oxyCODONE (OXY IR/ROXICODONE) 5 MG immediate release tablet Take 1 tablet (5 mg total) by mouth every 4 (four) hours as needed for severe pain (pain score 7-10) or breakthrough pain. 5 tablet 0 Past Week   polyethylene glycol powder (GLYCOLAX/MIRALAX) 17 GM/SCOOP powder Take 17 g by mouth daily as needed. 510 g 1 Past Week   Prenatal Vit-Fe Fumarate-FA (PREPLUS) 27-1 MG TABS Take 1 tablet by mouth daily. 30 tablet 13 Past Month   traMADol (ULTRAM) 50 MG tablet Take 1 tablet (50 mg total) by mouth every 6 (six) hours as needed. 8 tablet 0 11/18/2023   acetaminophen (TYLENOL) 325 MG tablet Take 2 tablets (650 mg total) by mouth every 4 (four) hours as needed (for pain scale < 4). (Patient not taking: Reported on 11/13/2023) 30 tablet 0    metoCLOPramide (REGLAN) 10 MG tablet Take 1 tablet (10 mg total) by mouth every 6 (six) hours. (Patient not taking: Reported on 11/13/2023) 30 tablet 1    misoprostol (CYTOTEC) 200 MCG tablet Place four tablets in between your gums and cheeks (two tablets on  each side) as instructed OR insert four tablets vaginally. May repeat in 4-6 hours if no miscarriage begins 4 tablet 1    ondansetron (ZOFRAN-ODT) 4 MG disintegrating tablet Take 1 tablet (4 mg total) by mouth every 6 (six) hours as needed for nausea. 20 tablet 0    valACYclovir (VALTREX) 1000 MG tablet Take 1 tablet (1,000 mg total) by mouth 2 (two) times daily. (Patient not taking: Reported on 09/04/2023) 20 tablet 0     Review of Systems  All other systems reviewed and are negative.  Physical Exam   Blood pressure 109/73, pulse 73, temperature 97.9 F (36.6 C), temperature source Oral, resp. rate 16, last menstrual period 09/07/2023, SpO2 99%, currently breastfeeding.  Physical Exam Vitals and nursing note reviewed.  Constitutional:      General: She is not in acute distress.    Appearance: She is well-developed. She is not ill-appearing.  HENT:     Head: Normocephalic and  atraumatic.  Eyes:     General: No scleral icterus.       Right eye: No discharge.        Left eye: No discharge.     Conjunctiva/sclera: Conjunctivae normal.  Pulmonary:     Effort: Pulmonary effort is normal. No respiratory distress.  Abdominal:     Tenderness: There is no abdominal tenderness.  Genitourinary:    Comments: Pelvic exam: small amount of dark red blood.  Neurological:     General: No focal deficit present.     Mental Status: She is alert.  Psychiatric:        Mood and Affect: Mood normal.        Behavior: Behavior normal.     MAU Course  Procedures Results for orders placed or performed during the hospital encounter of 11/18/23 (from the past 24 hour(s))  CBC     Status: Abnormal   Collection Time: 11/18/23  6:56 PM  Result Value Ref Range   WBC 4.6 4.0 - 10.5 K/uL   RBC 4.15 3.87 - 5.11 MIL/uL   Hemoglobin 10.6 (L) 12.0 - 15.0 g/dL   HCT 16.1 (L) 09.6 - 04.5 %   MCV 79.5 (L) 80.0 - 100.0 fL   MCH 25.5 (L) 26.0 - 34.0 pg   MCHC 32.1 30.0 - 36.0 g/dL   RDW 40.9 81.1 - 91.4 %   Platelets 303 150 - 400 K/uL   nRBC 0.0 0.0 - 0.2 %   No results found.  MDM -Ultrasound on 11/21 confirmed completed miscarriage, no evidence of RPOCs. Small amount of bleeding on exam. Vital signs & hemoglobin stable.  -Abdominal pain likely exacerbated by constipation. Given enema in MAU with good results. Discussed bowel management at home.  -Patient requesting refill for tramadol. Per chart review, she received oxycodone #5 on 11/18 & tramadol #8 on 11/21. States she has been taking the tramadol every 6 hours as it was prescribed. Discussed that medication is likely the cause of her constipation. Prescribed #4 & instructed to take for breakthrough pain after tylenol & ibuprofen. As miscarriage has completed she should not need pain medication specific to the SAB beyond that.   Assessment and Plan   1. Complete miscarriage   2. Drug-induced constipation    -Reviewed return  precautions -Reviewed meds at home for constipation treatment   Judeth Horn  11/18/2023 8:16 PM

## 2024-01-25 ENCOUNTER — Inpatient Hospital Stay (HOSPITAL_COMMUNITY)
Admission: AD | Admit: 2024-01-25 | Discharge: 2024-01-25 | Disposition: A | Discharge status: Home / Self Care | Payer: Medicaid Other | Attending: Obstetrics and Gynecology | Admitting: Obstetrics and Gynecology

## 2024-01-25 ENCOUNTER — Other Ambulatory Visit: Payer: Self-pay

## 2024-01-25 ENCOUNTER — Inpatient Hospital Stay (HOSPITAL_COMMUNITY): Payer: Medicaid Other

## 2024-01-25 ENCOUNTER — Encounter (HOSPITAL_COMMUNITY): Payer: Self-pay | Admitting: *Deleted

## 2024-01-25 DIAGNOSIS — O99511 Diseases of the respiratory system complicating pregnancy, first trimester: Secondary | ICD-10-CM | POA: Insufficient documentation

## 2024-01-25 DIAGNOSIS — O09291 Supervision of pregnancy with other poor reproductive or obstetric history, first trimester: Secondary | ICD-10-CM | POA: Diagnosis present

## 2024-01-25 DIAGNOSIS — O23591 Infection of other part of genital tract in pregnancy, first trimester: Secondary | ICD-10-CM | POA: Insufficient documentation

## 2024-01-25 DIAGNOSIS — B3731 Acute candidiasis of vulva and vagina: Secondary | ICD-10-CM | POA: Diagnosis not present

## 2024-01-25 DIAGNOSIS — R109 Unspecified abdominal pain: Secondary | ICD-10-CM | POA: Diagnosis not present

## 2024-01-25 DIAGNOSIS — B009 Herpesviral infection, unspecified: Secondary | ICD-10-CM | POA: Insufficient documentation

## 2024-01-25 DIAGNOSIS — B379 Candidiasis, unspecified: Secondary | ICD-10-CM

## 2024-01-25 DIAGNOSIS — O98811 Other maternal infectious and parasitic diseases complicating pregnancy, first trimester: Secondary | ICD-10-CM | POA: Diagnosis not present

## 2024-01-25 DIAGNOSIS — O26891 Other specified pregnancy related conditions, first trimester: Secondary | ICD-10-CM | POA: Diagnosis not present

## 2024-01-25 DIAGNOSIS — O26899 Other specified pregnancy related conditions, unspecified trimester: Secondary | ICD-10-CM

## 2024-01-25 DIAGNOSIS — J45909 Unspecified asthma, uncomplicated: Secondary | ICD-10-CM | POA: Diagnosis not present

## 2024-01-25 DIAGNOSIS — O24111 Pre-existing diabetes mellitus, type 2, in pregnancy, first trimester: Secondary | ICD-10-CM | POA: Insufficient documentation

## 2024-01-25 DIAGNOSIS — Z3A01 Less than 8 weeks gestation of pregnancy: Secondary | ICD-10-CM

## 2024-01-25 LAB — WET PREP, GENITAL
Clue Cells Wet Prep HPF POC: NONE SEEN
Sperm: NONE SEEN
Trich, Wet Prep: NONE SEEN
WBC, Wet Prep HPF POC: <10 (ref <10)

## 2024-01-25 LAB — COMPREHENSIVE METABOLIC PANEL
ALT: 16 U/L (ref 0–44)
AST: 17 U/L (ref 15–41)
Albumin: 3.7 g/dL (ref 3.5–5.0)
Alkaline Phosphatase: 79 U/L (ref 38–126)
Anion gap: 8 (ref 5–15)
BUN: <5 mg/dL — ABNORMAL LOW (ref 6–20)
CO2: 24 mmol/L (ref 22–32)
Calcium: 9 mg/dL (ref 8.9–10.3)
Chloride: 106 mmol/L (ref 98–111)
Creatinine, Ser: 0.72 mg/dL (ref 0.44–1.00)
GFR, Estimated: >60 mL/min (ref >60)
Glucose, Bld: 103 mg/dL — ABNORMAL HIGH (ref 70–99)
Potassium: 3.9 mmol/L (ref 3.5–5.1)
Sodium: 138 mmol/L (ref 135–145)
Total Bilirubin: 0.4 mg/dL (ref 0.0–1.2)
Total Protein: 6.3 g/dL — ABNORMAL LOW (ref 6.5–8.1)

## 2024-01-25 LAB — CBC
HCT: 34.9 % — ABNORMAL LOW (ref 36.0–46.0)
Hemoglobin: 11.5 g/dL — ABNORMAL LOW (ref 12.0–15.0)
MCH: 26.3 pg (ref 26.0–34.0)
MCHC: 33 g/dL (ref 30.0–36.0)
MCV: 79.9 fL — ABNORMAL LOW (ref 80.0–100.0)
Platelets: 284 10*3/uL (ref 150–400)
RBC: 4.37 MIL/uL (ref 3.87–5.11)
RDW: 14.2 % (ref 11.5–15.5)
WBC: 4.5 10*3/uL (ref 4.0–10.5)
nRBC: 0 % (ref 0.0–0.2)

## 2024-01-25 LAB — URINALYSIS, ROUTINE W REFLEX MICROSCOPIC
Bilirubin Urine: NEGATIVE
Glucose, UA: NEGATIVE mg/dL
Hgb urine dipstick: NEGATIVE
Ketones, ur: NEGATIVE mg/dL
Leukocytes,Ua: NEGATIVE
Nitrite: NEGATIVE
Protein, ur: NEGATIVE mg/dL
Specific Gravity, Urine: 1.019 (ref 1.005–1.030)
pH: 5 (ref 5.0–8.0)

## 2024-01-25 LAB — POCT PREGNANCY, URINE: Preg Test, Ur: POSITIVE — AB

## 2024-01-25 LAB — HCG, QUANTITATIVE, PREGNANCY: hCG, Beta Chain, Quant, S: 3111 m[IU]/mL — ABNORMAL HIGH (ref <5)

## 2024-01-25 MED ORDER — ACETAMINOPHEN 500 MG PO TABS: 1000.0000 mg | ORAL_TABLET | Freq: Once | ORAL | Status: DC

## 2024-01-25 MED ORDER — TERCONAZOLE 0.4 % VA CREA: 1.0000 | TOPICAL_CREAM | Freq: Every day | VAGINAL | 0 refills | Status: DC

## 2024-01-25 NOTE — MAU Provider Note (Addendum)
MAU Provider Note  History   Chelsea Herman , a  18 y.o. [phone] at [redacted]w[redacted]d presents to MAU for c/o abdominal cramping for ~1 week. Patient reports her pain a 7/10 at times but reports just some discomfort now. Patient reports she developed a HA today but is mild and does not need nor has taken any meds for either of her sx's. She denies VB, abnormal vaginal discharge, dysuria, vaginal irritation, fever, or chills. Patient reports normal bowel function, endorses nausea denies vomiting. She reports a +HPT yesterday. This is a desired pregnancy. Patient reports she has been taken PNV and is inquiring about LDASA.   CSN: [id number]  Arrival date and time: 01/25/24 1345    Chief Complaint  Patient presents with   Cramping   Headache   Headache  The current episode started today. The problem has been unchanged. The pain does not radiate. The quality of the pain is described as aching. The pain is at a severity of 3/10. The pain is mild. Associated symptoms include abdominal pain and nausea. Pertinent negatives include no fever or vomiting. Nothing aggravates the symptoms. She has tried nothing for the symptoms.    OB History     Gravida  4   Para  2   Term  2   Preterm  0   AB  1   Living  2      SAB  1   IAB  0   Ectopic  0   Multiple  0   Live Births  2           Past Medical History:  Diagnosis Date   Asthma    as a child-no current medications or treatments   Diabetes mellitus without complication (HCC)    Gestational diabetes    History of gestational hypertension 02/04/2021    Past Surgical History:  Procedure Laterality Date   NO PAST SURGERIES      Family History  Problem Relation Age of Onset   Hearing loss Mother    Asthma Neg Hx    Cancer Neg Hx    Diabetes Neg Hx    Stroke Neg Hx     Social History   Tobacco Use   Smoking status: Never    Passive exposure: Never   Smokeless tobacco: Never  Vaping Use   Vaping status: Never Used   Substance Use Topics   Alcohol use: Not Currently   Drug use: Not Currently    Allergies:  Allergies  Allergen Reactions   Peanut-Containing Drug Products Anaphylaxis   Citric Acid Rash    No medications prior to admission.    Review of Systems  Constitutional:  Negative for chills and fever.  Gastrointestinal:  Positive for abdominal pain and nausea. Negative for constipation, diarrhea and vomiting.  Genitourinary:  Negative for dysuria, vaginal bleeding and vaginal discharge.  Neurological:  Positive for headaches.   Physical Exam   Blood pressure 126/77, pulse 79, temperature 98.1 F (36.7 C), temperature source Oral, resp. rate 18, height 5\' 5"  (1.651 m), weight 90.7 kg, last menstrual period 12/21/2023, SpO2 100%, unknown if currently breastfeeding.  Physical Exam Constitutional:      General: She is not in acute distress.    Appearance: She is well-developed. She is not ill-appearing.  Cardiovascular:     Rate and Rhythm: Normal rate.  Pulmonary:     Effort: Pulmonary effort is normal.  Abdominal:     Tenderness: There is no abdominal tenderness.  Skin:  General: Skin is warm and dry.  Neurological:     Mental Status: She is alert and oriented to person, place, and time.  Psychiatric:        Mood and Affect: Mood normal.    MAU Course  Procedures  Orders Placed This Encounter  Procedures   Wet prep, genital   US OB LESS THAN 14 WEEKS WITH OB TRANSVAGINAL   Urinalysis, Routine w reflex microscopic -Urine, Clean Catch   hCG, quantitative, pregnancy   CBC   Comprehensive metabolic panel   Pregnancy, urine POC   ABO/Rh   Discharge patient    Results for orders placed or performed during the hospital encounter of 01/25/24 (from the past 24 hours)  Pregnancy, urine POC     Status: Abnormal   Collection Time: 01/25/24  2:25 PM  Result Value Ref Range   Preg Test, Ur POSITIVE (A) NEGATIVE  Urinalysis, Routine w reflex microscopic -Urine, Clean Catch      Status: Abnormal   Collection Time: 01/25/24  2:29 PM  Result Value Ref Range   Color, Urine YELLOW YELLOW   APPearance HAZY (A) CLEAR   Specific Gravity, Urine 1.019 1.005 - 1.030   pH 5.0 5.0 - 8.0   Glucose, UA NEGATIVE NEGATIVE mg/dL   Hgb urine dipstick NEGATIVE NEGATIVE   Bilirubin Urine NEGATIVE NEGATIVE   Ketones, ur NEGATIVE NEGATIVE mg/dL   Protein, ur NEGATIVE NEGATIVE mg/dL   Nitrite NEGATIVE NEGATIVE   Leukocytes,Ua NEGATIVE NEGATIVE  ABO/Rh     Status: None   Collection Time: 01/25/24  2:52 PM  Result Value Ref Range   ABO/RH(D) A POS    No rh immune globuloin      NOT A RH IMMUNE GLOBULIN CANDIDATE, PT RH POSITIVE Performed at [facility], [address] [place], [place] [address]   hCG, quantitative, pregnancy     Status: Abnormal   Collection Time: 01/25/24  2:53 PM  Result Value Ref Range   hCG, Beta Chain, Quant, S 3,111 (H) <5 mIU/mL  CBC     Status: Abnormal   Collection Time: 01/25/24  2:53 PM  Result Value Ref Range   WBC 4.5 4.0 - 10.5 K/uL   RBC 4.37 3.87 - 5.11 MIL/uL   Hemoglobin 11.5 (L) 12.0 - 15.0 g/dL   HCT 60.4 (L) 54.0 - 98.1 %   MCV 79.9 (L) 80.0 - 100.0 fL   MCH 26.3 26.0 - 34.0 pg   MCHC 33.0 30.0 - 36.0 g/dL   RDW 19.1 47.8 - 29.5 %   Platelets 284 150 - 400 K/uL   nRBC 0.0 0.0 - 0.2 %  Comprehensive metabolic panel     Status: Abnormal   Collection Time: 01/25/24  2:53 PM  Result Value Ref Range   Sodium 138 135 - 145 mmol/L   Potassium 3.9 3.5 - 5.1 mmol/L   Chloride 106 98 - 111 mmol/L   CO2 24 22 - 32 mmol/L   Glucose, Bld 103 (H) 70 - 99 mg/dL   BUN <5 (L) 6 - 20 mg/dL   Creatinine, Ser 6.21 0.44 - 1.00 mg/dL   Calcium 9.0 8.9 - 30.8 mg/dL   Total Protein 6.3 (L) 6.5 - 8.1 g/dL   Albumin 3.7 3.5 - 5.0 g/dL   AST 17 15 - 41 U/L   ALT 16 0 - 44 U/L   Alkaline Phosphatase 79 38 - 126 U/L   Total Bilirubin 0.4 0.0 - 1.2 mg/dL   GFR,  Estimated >60 >60 mL/min   Anion gap 8 5 - 15  Wet prep, genital     Status:  Abnormal   Collection Time: 01/25/24  3:45 PM   Specimen: PATH Cytology Cervicovaginal Ancillary Only  Result Value Ref Range   Yeast Wet Prep HPF POC PRESENT (A) NONE SEEN   Trich, Wet Prep NONE SEEN NONE SEEN   Clue Cells Wet Prep HPF POC NONE SEEN NONE SEEN   WBC, Wet Prep HPF POC <10 <10   Sperm NONE SEEN     IMPRESSION: Probable early intrauterine gestational sac, but no yolk sac, fetal pole, or cardiac activity yet visualized. Recommend follow-up quantitative B-HCG levels and follow-up US in 14 days to assess viability. This recommendation follows SRU consensus guidelines: Diagnostic Criteria for Nonviable Pregnancy Early in the First Trimester. Malva Limes Med 2013; 147:8295-62.   Small subchorionic hemorrhage.  MDM  Ddx: Ectopic pregnancy Pregnancy related symptoms Bladder/kidney infection, unspecified Vaginal infection, unspecified  +HPT. SAB in 10/2023 with confirmation of expelled POC. 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   -Declined medication for HA -Work-up: CBC, CMP, quant HCG, ABO/Rh, wet prep, GC/C, UA, TVUS  Assessment and Plan  [redacted] week gestation of pregnancy -Single IUP with measured gestational age of [redacted]w[redacted]d -Recommend viability scan in 7-10 days Message sent to [facility]. Patient wishes to establish care there  2. Vaginal yeast infection -Rx for terazole sent to pharmacy  Questions encouraged and answered Return precautions given and reviewed  Sallye Ober, [facility]  Attestation of Supervision of Student:  I confirm that I have verified the information documented in the midwife student's note and that I have also personally reperformed the history, physical exam and all medical decision making activities.  I have verified that all services and findings are accurately documented in this student's note; and I agree with management and plan as outlined in the documentation. I have also made any necessary editorial  changes.  Joanne Gavel, MD OB Fellow 01/25/2024 5:56 PM

## 2024-01-25 NOTE — MAU Note (Signed)
Chelsea Herman is a 18 y.o. at here in MAU reporting: she's been having lower abdominal cramping that's been occurring for approximately 1 week, denies current pain or VB.  Had +HPT yesterday.  Also c/o HA, hasn't taken any meds to treat.  LMP: 12/21/2023 Onset of complaint: 1 week Pain score: 3 HA Vitals:   01/25/24 1402  BP: 126/77  Pulse: 79  Resp: 18  Temp: 98.1 F (36.7 C)  SpO2: 100%     FHT: NA  Lab orders placed from triage: UPT

## 2024-01-26 LAB — ABO/RH: ABO/RH(D): A POS

## 2024-01-26 LAB — GC/CHLAMYDIA PROBE AMP (~~LOC~~) NOT AT ARMC
Chlamydia: NEGATIVE
Comment: NEGATIVE
Comment: NORMAL
Neisseria Gonorrhea: NEGATIVE

## 2024-01-29 ENCOUNTER — Other Ambulatory Visit: Payer: Self-pay

## 2024-01-29 DIAGNOSIS — O039 Complete or unspecified spontaneous abortion without complication: Secondary | ICD-10-CM

## 2024-01-29 DIAGNOSIS — O021 Missed abortion: Secondary | ICD-10-CM

## 2024-01-31 ENCOUNTER — Inpatient Hospital Stay (HOSPITAL_COMMUNITY)
Admission: AD | Admit: 2024-01-31 | Discharge: 2024-01-31 | Disposition: A | Discharge status: Home / Self Care | Payer: Medicaid Other | Attending: Obstetrics & Gynecology | Admitting: Obstetrics & Gynecology

## 2024-01-31 DIAGNOSIS — E860 Dehydration: Secondary | ICD-10-CM | POA: Diagnosis present

## 2024-01-31 DIAGNOSIS — Z3A01 Less than 8 weeks gestation of pregnancy: Secondary | ICD-10-CM

## 2024-01-31 DIAGNOSIS — O209 Hemorrhage in early pregnancy, unspecified: Secondary | ICD-10-CM

## 2024-01-31 DIAGNOSIS — R252 Cramp and spasm: Secondary | ICD-10-CM | POA: Diagnosis present

## 2024-01-31 LAB — CBC WITH DIFFERENTIAL/PLATELET
Abs Immature Granulocytes: 0.02 10*3/uL (ref 0.00–0.07)
Basophils Absolute: 0 10*3/uL (ref 0.0–0.1)
Basophils Relative: 0 %
Eosinophils Absolute: 0.3 10*3/uL (ref 0.0–0.5)
Eosinophils Relative: 6 %
HCT: 34.7 % — ABNORMAL LOW (ref 36.0–46.0)
Hemoglobin: 11.4 g/dL — ABNORMAL LOW (ref 12.0–15.0)
Immature Granulocytes: 0 %
Lymphocytes Relative: 38 %
Lymphs Abs: 2.1 10*3/uL (ref 0.7–4.0)
MCH: 26.4 pg (ref 26.0–34.0)
MCHC: 32.9 g/dL (ref 30.0–36.0)
MCV: 80.3 fL (ref 80.0–100.0)
Monocytes Absolute: 0.5 10*3/uL (ref 0.1–1.0)
Monocytes Relative: 8 %
Neutro Abs: 2.5 10*3/uL (ref 1.7–7.7)
Neutrophils Relative %: 48 %
Platelets: 319 10*3/uL (ref 150–400)
RBC: 4.32 MIL/uL (ref 3.87–5.11)
RDW: 14.2 % (ref 11.5–15.5)
WBC: 5.4 10*3/uL (ref 4.0–10.5)
nRBC: 0 % (ref 0.0–0.2)

## 2024-01-31 LAB — URINALYSIS, ROUTINE W REFLEX MICROSCOPIC
Bilirubin Urine: NEGATIVE
Glucose, UA: NEGATIVE mg/dL
Hgb urine dipstick: NEGATIVE
Ketones, ur: NEGATIVE mg/dL
Leukocytes,Ua: NEGATIVE
Nitrite: NEGATIVE
Protein, ur: NEGATIVE mg/dL
Specific Gravity, Urine: 1.029 (ref 1.005–1.030)
pH: 5 (ref 5.0–8.0)

## 2024-01-31 LAB — HCG, QUANTITATIVE, PREGNANCY: hCG, Beta Chain, Quant, S: 13375 m[IU]/mL — ABNORMAL HIGH (ref <5)

## 2024-01-31 NOTE — MAU Note (Addendum)
 Chelsea Herman is a 18 y.o. at [redacted]w[redacted]d here in MAU reporting: was here for cramping , still cramping, but no longer having pregnancy symptoms.  2 days ago saw pink when she wiped one time.  Been having loose stools, twice a day- all wk Onset of complaint: ongoing, not getting worse Believes she is dehydrated.  (Has not eaten or drank anything since 0900 was alseep all day. Pain score: moderate Vitals:   01/31/24 1809  BP: 115/68  Pulse: 82  Resp: 17  Temp: 98.2 F (36.8 C)  SpO2: 100%      Lab orders placed from triage:  urine

## 2024-01-31 NOTE — Discharge Instructions (Addendum)
 You were seen tonight for vaginal bleeding in early pregnancy. Your pregnancy hormone level has risen appropriately and your other labs and vitals show you are stable. You have an appointment in 2 days (02/02/2024) for an ultrasound. I recommend keeping this appointment as scheduled. Return to MAU if you have heavy bleeding - filling a pad in an hour or less or passing clots the size of a chicken egg or larger or bleeding with dizziness.  Thank you for trusting us  to care for you, Camie, Midwife

## 2024-01-31 NOTE — MAU Provider Note (Signed)
 None   S Ms. Joylyn Faircloth is a 18 y.o. [phone] pregnant female at [redacted]w[redacted]d who presents to MAU today with complaint of not feeling pregnant anymore and scant bleeding two days ago. Also reports GI symptoms, not eating or drinking all day today due to sleeping all day. Reports feeling dehydrated.   Viability scan ordered for 02/02/24.   Pertinent items noted in HPI and remainder of comprehensive ROS otherwise negative.   O BP 128/79   Pulse 82   Temp 98.2 F (36.8 C) (Oral)   Resp 17   Ht 5' 5 (1.651 m)   Wt 90.7 kg   LMP 12/21/2023   SpO2 100%   BMI 33.28 kg/m  Physical Exam Vitals reviewed.  Constitutional:      General: She is not in acute distress.    Appearance: Normal appearance. She is well-developed. She is not ill-appearing, toxic-appearing or diaphoretic.  HENT:     Head: Normocephalic.  Cardiovascular:     Rate and Rhythm: Normal rate.     Pulses: Normal pulses.     Heart sounds: Normal heart sounds.  Pulmonary:     Effort: Pulmonary effort is normal.  Skin:    General: Skin is warm and dry.     Capillary Refill: Capillary refill takes less than 2 seconds.  Neurological:     Mental Status: She is alert and oriented to person, place, and time.  Psychiatric:        Mood and Affect: Mood normal.        Behavior: Behavior normal.        Thought Content: Thought content normal.        Judgment: Judgment normal.      MDM: Appropriate rise in betaHcG. No further bleeding since episode 2 days ago. Patient eating and drinking here and able to tolerate well.   MAU Course:  A Vaginal bleeding affecting early pregnancy - Plan: Discharge patient  Medical screening exam complete - Appropriate rise in betaHcG. No further bleeding since episode 2 days ago. Patient eating and drinking here and able to tolerate well.  - Ketones present on urine. However, patient reports that she was asleep all day and did not eat or drink. Suspect that with adequate intake status  would improve.   P Discharge from MAU in stable condition with routine precautions Follow up for US  as scheduled 02/02/24.   Allergies as of 01/31/2024       Reactions   Peanut-containing Drug Products Anaphylaxis   Citric Acid Rash        Medication List     STOP taking these medications    polyethylene glycol powder 17 GM/SCOOP powder Commonly known as: GLYCOLAX /MIRALAX        TAKE these medications    aspirin  81 MG chewable tablet Chew 81 mg by mouth daily.   PrePLUS 27-1 MG Tabs Take 1 tablet by mouth daily.   terconazole  0.4 % vaginal cream Commonly known as: TERAZOL 7  Place 1 applicator vaginally at bedtime. Use for seven days        Camie Rote, MSN, CNM 01/31/2024 9:41 PM  Certified Nurse Midwife, [facility]

## 2024-02-02 ENCOUNTER — Ambulatory Visit (HOSPITAL_COMMUNITY)
Admission: RE | Admit: 2024-02-02 | Discharge: 2024-02-02 | Disposition: A | Discharge status: Home / Self Care | Payer: Medicaid Other | Source: Ambulatory Visit | Attending: Certified Nurse Midwife | Admitting: Certified Nurse Midwife

## 2024-02-02 DIAGNOSIS — O021 Missed abortion: Secondary | ICD-10-CM | POA: Insufficient documentation

## 2024-02-19 ENCOUNTER — Encounter (HOSPITAL_COMMUNITY): Payer: Self-pay | Admitting: Obstetrics and Gynecology

## 2024-02-19 ENCOUNTER — Inpatient Hospital Stay (HOSPITAL_COMMUNITY)
Admission: AD | Admit: 2024-02-19 | Discharge: 2024-02-19 | Disposition: A | Discharge status: Home / Self Care | Payer: Medicaid Other | Attending: Obstetrics and Gynecology | Admitting: Obstetrics and Gynecology

## 2024-02-19 DIAGNOSIS — O219 Vomiting of pregnancy, unspecified: Secondary | ICD-10-CM

## 2024-02-19 DIAGNOSIS — O26891 Other specified pregnancy related conditions, first trimester: Secondary | ICD-10-CM | POA: Insufficient documentation

## 2024-02-19 DIAGNOSIS — Z3A08 8 weeks gestation of pregnancy: Secondary | ICD-10-CM

## 2024-02-19 DIAGNOSIS — R197 Diarrhea, unspecified: Secondary | ICD-10-CM

## 2024-02-19 LAB — CBC WITH DIFFERENTIAL/PLATELET
Abs Immature Granulocytes: 0.01 10*3/uL (ref 0.00–0.07)
Basophils Absolute: 0 10*3/uL (ref 0.0–0.1)
Basophils Relative: 0 %
Eosinophils Absolute: 0.1 10*3/uL (ref 0.0–0.5)
Eosinophils Relative: 2 %
HCT: 38.7 % (ref 36.0–46.0)
Hemoglobin: 13 g/dL (ref 12.0–15.0)
Immature Granulocytes: 0 %
Lymphocytes Relative: 11 %
Lymphs Abs: 0.8 10*3/uL (ref 0.7–4.0)
MCH: 27 pg (ref 26.0–34.0)
MCHC: 33.6 g/dL (ref 30.0–36.0)
MCV: 80.5 fL (ref 80.0–100.0)
Monocytes Absolute: 0.6 10*3/uL (ref 0.1–1.0)
Monocytes Relative: 7 %
Neutro Abs: 6 10*3/uL (ref 1.7–7.7)
Neutrophils Relative %: 80 %
Platelets: 325 10*3/uL (ref 150–400)
RBC: 4.81 MIL/uL (ref 3.87–5.11)
RDW: 14 % (ref 11.5–15.5)
WBC: 7.6 10*3/uL (ref 4.0–10.5)
nRBC: 0 % (ref 0.0–0.2)

## 2024-02-19 LAB — COMPREHENSIVE METABOLIC PANEL
ALT: 14 U/L (ref 0–44)
AST: 18 U/L (ref 15–41)
Albumin: 3.9 g/dL (ref 3.5–5.0)
Alkaline Phosphatase: 72 U/L (ref 38–126)
Anion gap: 9 (ref 5–15)
BUN: 7 mg/dL (ref 6–20)
CO2: 22 mmol/L (ref 22–32)
Calcium: 9.1 mg/dL (ref 8.9–10.3)
Chloride: 103 mmol/L (ref 98–111)
Creatinine, Ser: 0.7 mg/dL (ref 0.44–1.00)
GFR, Estimated: >60 mL/min (ref >60)
Glucose, Bld: 88 mg/dL (ref 70–99)
Potassium: 3.8 mmol/L (ref 3.5–5.1)
Sodium: 134 mmol/L — ABNORMAL LOW (ref 135–145)
Total Bilirubin: 0.7 mg/dL (ref 0.0–1.2)
Total Protein: 7.1 g/dL (ref 6.5–8.1)

## 2024-02-19 LAB — URINALYSIS, ROUTINE W REFLEX MICROSCOPIC
Bilirubin Urine: NEGATIVE
Glucose, UA: NEGATIVE mg/dL
Hgb urine dipstick: NEGATIVE
Ketones, ur: NEGATIVE mg/dL
Leukocytes,Ua: NEGATIVE
Nitrite: NEGATIVE
Protein, ur: NEGATIVE mg/dL
Specific Gravity, Urine: 1.023 (ref 1.005–1.030)
pH: 5 (ref 5.0–8.0)

## 2024-02-19 MED ORDER — SODIUM CHLORIDE 0.9 % IV SOLN
25.0000 mg | Freq: Once | INTRAVENOUS | Status: AC
Start: 2024-02-19 — End: 2024-02-19
  Administered 2024-02-19: 25 mg via INTRAVENOUS
  Filled 2024-02-19: qty 1

## 2024-02-19 MED ORDER — LACTATED RINGERS IV BOLUS
1000.0000 mL | Freq: Once | INTRAVENOUS | Status: AC
  Administered 2024-02-19: 1000 mL via INTRAVENOUS

## 2024-02-19 MED ORDER — PROMETHAZINE HCL 25 MG PO TABS: 25.0000 mg | ORAL_TABLET | Freq: Four times a day (QID) | ORAL | 2 refills | Status: DC | PRN

## 2024-02-19 MED ORDER — ONDANSETRON 4 MG PO TBDP: 4.0000 mg | ORAL_TABLET | Freq: Four times a day (QID) | ORAL | 0 refills | Status: DC | PRN

## 2024-02-19 MED ORDER — SCOPOLAMINE 1 MG/3DAYS TD PT72
1.0000 | MEDICATED_PATCH | Freq: Once | TRANSDERMAL | Status: DC
  Administered 2024-02-19: 1.5 mg via TRANSDERMAL
  Filled 2024-02-19: qty 1

## 2024-02-19 MED ORDER — FAMOTIDINE IN NACL 20-0.9 MG/50ML-% IV SOLN
20.0000 mg | Freq: Once | INTRAVENOUS | Status: AC
  Administered 2024-02-19: 20 mg via INTRAVENOUS
  Filled 2024-02-19: qty 50

## 2024-02-19 NOTE — MAU Note (Signed)
.  Chelsea Herman is a 18 y.o. at [redacted]w[redacted]d here in MAU reporting: N/V that began today. She reports she had one episode of loose stools that was accompanied by loose stool. Last episode of emesis 30 minutes ago. Has had 7 episodes of emesis today. Denies VB.  Onset of complaint: Today Pain score: 10/10 abdomen  Vitals:   02/19/24 1812  BP: 121/79  Pulse: 76  Resp: 16  Temp: 97.9 F (36.6 C)  SpO2: 100%     FHT: n/a Lab orders placed from triage: UA

## 2024-02-19 NOTE — MAU Provider Note (Cosign Needed Addendum)
 None     Chelsea Herman is a 18 y.o. [phone] pregnant female at [redacted]w[redacted]d who presents to MAU today with complaint of nausea and vomiting with 7 episodes today. She endorses 2 episodes of diarrhea "loose watery" stool.   She denies use of marijuana, reports the smell in the room is due to partner'Chelsea use. CNM informed pt this questions is not punative, it is simply to gauge the best medication for her, she reports she does not use cannabis.   Receives care at [facility]. Prenatal records reviewed.  Pertinent items noted in HPI and remainder of comprehensive ROS otherwise negative.   O BP 121/79 (BP Location: Right Arm)   Pulse 76   Temp 97.9 F (36.6 C) (Oral)   Resp 16   Ht 5\' 5"  (1.651 m)   Wt 90.9 kg   LMP 12/21/2023   SpO2 100%   BMI 33.35 kg/m  Physical Exam Vitals reviewed.  Constitutional:      General: She is not in acute distress.    Appearance: Normal appearance. She is ill-appearing. She is not toxic-appearing or diaphoretic.  HENT:     Head: Normocephalic.  Cardiovascular:     Rate and Rhythm: Normal rate and regular rhythm.     Pulses: Normal pulses.     Heart sounds: Normal heart sounds.  Pulmonary:     Effort: Pulmonary effort is normal.     Breath sounds: Normal breath sounds.  Skin:    General: Skin is warm and dry.     Capillary Refill: Capillary refill takes less than 2 seconds.  Neurological:     Mental Status: She is alert and oriented to person, place, and time.  Psychiatric:        Mood and Affect: Mood normal.        Behavior: Behavior normal.     Comments: Patient visibly distressed.       MDM: Nausea and vomiting of pregnancy Trial of antiemetics and fluids before a PO challenge CBC and CMP.   MAU Course:  A Nausea and vomiting of pregnancy, antepartum  [redacted] weeks gestation of pregnancy  Medical screening exam complete  P IV fluids and antiemetics. CBC and CMP.  PO challenge once medications and fluids complete.   Lamont Snowball,  MSN, CNM 02/19/2024 8:09 PM  Certified Nurse Midwife, [facility]   20:10 Assumed care from CNM [facility] Awaiting reassessment.   2215:  Reassessment Feeling much better after IV fluids and Medications Able to tolerate PO intake  Results for orders placed or performed during the hospital encounter of 02/19/24 (from the past 24 hours)  Urinalysis, Routine w reflex microscopic -Urine, Clean Catch     Status: Abnormal   Collection Time: 02/19/24  7:17 PM  Result Value Ref Range   Color, Urine YELLOW YELLOW   APPearance HAZY (A) CLEAR   Specific Gravity, Urine 1.023 1.005 - 1.030   pH 5.0 5.0 - 8.0   Glucose, UA NEGATIVE NEGATIVE mg/dL   Hgb urine dipstick NEGATIVE NEGATIVE   Bilirubin Urine NEGATIVE NEGATIVE   Ketones, ur NEGATIVE NEGATIVE mg/dL   Protein, ur NEGATIVE NEGATIVE mg/dL   Nitrite NEGATIVE NEGATIVE   Leukocytes,Ua NEGATIVE NEGATIVE  CBC with Differential/Platelet     Status: None   Collection Time: 02/19/24  8:26 PM  Result Value Ref Range   WBC 7.6 4.0 - 10.5 K/uL   RBC 4.81 3.87 - 5.11 MIL/uL   Hemoglobin 13.0 12.0 - 15.0 g/dL  HCT 38.7 36.0 - 46.0 %   MCV 80.5 80.0 - 100.0 fL   MCH 27.0 26.0 - 34.0 pg   MCHC 33.6 30.0 - 36.0 g/dL   RDW 40.9 81.1 - 91.4 %   Platelets 325 150 - 400 K/uL   nRBC 0.0 0.0 - 0.2 %   Neutrophils Relative % 80 %   Neutro Abs 6.0 1.7 - 7.7 K/uL   Lymphocytes Relative 11 %   Lymphs Abs 0.8 0.7 - 4.0 K/uL   Monocytes Relative 7 %   Monocytes Absolute 0.6 0.1 - 1.0 K/uL   Eosinophils Relative 2 %   Eosinophils Absolute 0.1 0.0 - 0.5 K/uL   Basophils Relative 0 %   Basophils Absolute 0.0 0.0 - 0.1 K/uL   Immature Granulocytes 0 %   Abs Immature Granulocytes 0.01 0.00 - 0.07 K/uL  Comprehensive metabolic panel     Status: Abnormal   Collection Time: 02/19/24  8:26 PM  Result Value Ref Range   Sodium 134 (L) 135 - 145 mmol/L   Potassium 3.8 3.5 - 5.1 mmol/L   Chloride 103 98 - 111 mmol/L   CO2 22 22 - 32  mmol/L   Glucose, Bld 88 70 - 99 mg/dL   BUN 7 6 - 20 mg/dL   Creatinine, Ser 7.82 0.44 - 1.00 mg/dL   Calcium 9.1 8.9 - 95.6 mg/dL   Total Protein 7.1 6.5 - 8.1 g/dL   Albumin 3.9 3.5 - 5.0 g/dL   AST 18 15 - 41 U/L   ALT 14 0 - 44 U/L   Alkaline Phosphatase 72 38 - 126 U/L   Total Bilirubin 0.7 0.0 - 1.2 mg/dL   GFR, Estimated >21 >30 mL/min   Anion gap 9 5 - 15   No leukocytosis or electrolyte derangement Urine shows no overt dehydration  A:   Pregnancy at [redacted]w[redacted]d       Nausea, vomiting, diarrhea       Likely viral gastroenteritis  P:  Discharge home      Rx Zofran and Phenergan for nausea      Has Scopolamine patch in place      Advance diet as tolerated      Encouraged to return if she develops worsening of symptoms, increase in pain, fever, or other concerning symptoms.       Has new OB appt this week  Aviva Signs, CNM

## 2024-02-21 ENCOUNTER — Ambulatory Visit: Payer: Medicaid Other

## 2024-02-21 ENCOUNTER — Other Ambulatory Visit: Payer: Self-pay

## 2024-02-21 ENCOUNTER — Ambulatory Visit: Payer: Medicaid Other | Admitting: *Deleted

## 2024-02-21 VITALS — BP 115/75 | HR 63 | Wt 199.7 lb

## 2024-02-21 DIAGNOSIS — O0991 Supervision of high risk pregnancy, unspecified, first trimester: Secondary | ICD-10-CM

## 2024-02-21 DIAGNOSIS — Z3A09 9 weeks gestation of pregnancy: Secondary | ICD-10-CM | POA: Diagnosis not present

## 2024-02-21 DIAGNOSIS — Z3A08 8 weeks gestation of pregnancy: Secondary | ICD-10-CM

## 2024-02-21 DIAGNOSIS — Z8632 Personal history of gestational diabetes: Secondary | ICD-10-CM

## 2024-02-21 DIAGNOSIS — O0990 Supervision of high risk pregnancy, unspecified, unspecified trimester: Secondary | ICD-10-CM

## 2024-02-21 DIAGNOSIS — O209 Hemorrhage in early pregnancy, unspecified: Secondary | ICD-10-CM

## 2024-02-21 DIAGNOSIS — O3680X0 Pregnancy with inconclusive fetal viability, not applicable or unspecified: Secondary | ICD-10-CM

## 2024-02-21 DIAGNOSIS — Z8759 Personal history of other complications of pregnancy, childbirth and the puerperium: Secondary | ICD-10-CM

## 2024-02-21 MED ORDER — BLOOD PRESSURE KIT DEVI: 1.0000 | 0 refills | Status: AC | PRN

## 2024-02-21 NOTE — Patient Instructions (Signed)
 Options for Doula Care in the [place]  As you review your birthing options, consider having a birth doula. A doula is trained to provide support before, during and just after you give birth. There are also postpartum doulas that help you adjust to new parenthood.  While doulas do not provide medical care, they do provide emotional, physical and educational support. A few months before your baby arrives, doulas can help answer questions, ease concerns and help you create and support your birthing plan.    Doulas can help reduce your stress and comfort you and your partner. They can help you cope with labor by helping you use breathing techniques, massage, creative labor positioning, essential oils and affirmations.   Studies show that the benefits of having a doula include:   A more positive birth experience  Fewer requests for pain-relief medication  Less likelihood of cesarean section, commonly called a c-section   Doulas are typically hired via a Advertising account planner between you and the doula. We are happy to provide a list of the most active doulas in the area, all of whom are credentialed by [facility] and will not count as a visitor at your birth.  There are several options for no-cost doula care at our hospital, including:  [facility] Volunteer Doula Program Every [employer] Program A Cure 4 Moms Doula Study (available only at [employer] for Women, [place], [place] and [facility] offices)  For more information on these programs or to receive a list of doulas active in our area, please email [email or web address]

## 2024-02-21 NOTE — Progress Notes (Addendum)
 New OB Intake  I connected with [name]  on 02/21/24 at  8:15 AM EST by in person visit and verified that I am speaking with the correct person using two identifiers.  I discussed the limitations, risks, security and privacy concerns of performing an evaluation and management service by telephone and the availability of in person appointments. I also discussed with the patient that there may be a patient responsible charge related to this service. The patient expressed understanding and agreed to proceed.  I explained I am completing New OB Intake today. We discussed EDD of 09/26/2024, by Last Menstrual Period. Pt is [id number]. I reviewed her allergies, medications and Medical/Surgical/OB history.  She had started taking Baby aspirin after visit to MAU. I discussed with a provider in office and advised patient to wait until 12 weeks to take baby aspirin since she does not have a history of a blood clotting disorder. She reports seeing  bright red blood when she wipes after urinating starting yesterday and still is today. Discussed with Edd Arbour, CNM and viability [place] ordered for today in office.   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 Discussed the nature of our practice with multiple providers including residents and students. Due to the size of the practice, the delivering provider may not be the same as those providing prenatal care.   Patient is not interested in water  birth.  MyChart/Babyscripts MyChart access verified. I explained pt will have some visits in office and some virtually. Babyscripts instructions given and order placed.   Blood Pressure Cuff/Weight Scale Blood pressure cuff ordered for patient to pick-up from [employer]. Explained after first prenatal appt pt will check weekly and document in Babyscripts. Patient does have weight scale.  Anatomy US Explained next  scheduled [place] will be around 19 weeks. Anatomy US will be scheduled and she will be notified by MyChart.   Is patient a CenteringPregnancy candidate?  Accepted   Is patient a Mom+Baby Combined Care candidate?  Not a candidate    Interested in [place]? Yes, sent referral and doula dot phrase.   Is patient a candidate for Babyscripts Optimization? No, due to hx htn  and centering   First visit review I reviewed new OB appt with patient. Explained pt will be seen by Dr. Para March at first visit. Discussed Avelina Laine genetic screening with patient. She would like  Panorama drawn at new ob visit.  Routine prenatal labs drawn today   Last Pap No results found for: "DIAGPAP"  [name] 02/21/2024  11:29 AM

## 2024-02-22 ENCOUNTER — Encounter: Payer: Self-pay | Admitting: Obstetrics and Gynecology

## 2024-02-22 LAB — CBC/D/PLT+RPR+RH+ABO+RUBIGG...
Antibody Screen: NEGATIVE
Basophils Absolute: 0 10*3/uL (ref 0.0–0.2)
Basos: 0 %
EOS (ABSOLUTE): 0.2 10*3/uL (ref 0.0–0.4)
Eos: 4 %
HCV Ab: NONREACTIVE
HIV Screen 4th Generation wRfx: NONREACTIVE
Hematocrit: 37 % (ref 34.0–46.6)
Hemoglobin: 12.1 g/dL (ref 11.1–15.9)
Hepatitis B Surface Ag: NEGATIVE
Immature Grans (Abs): 0 10*3/uL (ref 0.0–0.1)
Immature Granulocytes: 0 %
Lymphocytes Absolute: 1.7 10*3/uL (ref 0.7–3.1)
Lymphs: 37 %
MCH: 27.3 pg (ref 26.6–33.0)
MCHC: 32.7 g/dL (ref 31.5–35.7)
MCV: 83 fL (ref 79–97)
Monocytes Absolute: 0.6 10*3/uL (ref 0.1–0.9)
Monocytes: 13 %
Neutrophils Absolute: 2.1 10*3/uL (ref 1.4–7.0)
Neutrophils: 46 %
Platelets: 309 10*3/uL (ref 150–450)
RBC: 4.44 x10E6/uL (ref 3.77–5.28)
RDW: 14.4 % (ref 11.7–15.4)
RPR Ser Ql: NONREACTIVE
Rh Factor: POSITIVE
Rubella Antibodies, IGG: 2.77 {index} (ref >0.99)
WBC: 4.5 10*3/uL (ref 3.4–10.8)

## 2024-02-22 LAB — HCV INTERPRETATION

## 2024-02-25 LAB — CULTURE, OB URINE

## 2024-02-25 LAB — URINE CULTURE, OB REFLEX

## 2024-02-27 ENCOUNTER — Encounter: Payer: Self-pay | Admitting: Obstetrics and Gynecology

## 2024-03-09 NOTE — Progress Notes (Unsigned)
   PRENATAL VISIT NOTE  Subjective:  Chelsea Herman is a 18 y.o. [id number] at [redacted]w[redacted]d being seen today for ongoing prenatal care.  She is currently monitored for the following issues for this {Blank single:19197::"high-risk","low-risk"} pregnancy and has Mild intermittent asthma without complication; Peanut allergy; History of gestational hypertension; History of gestational diabetes; HSV-2 (herpes simplex virus 2) infection; Supervision of high risk pregnancy, antepartum; and Vaginal bleeding in pregnancy, first trimester on their problem list.  Patient reports {sx:14538}.   .  .   . Denies leaking of fluid.   The following portions of the patient's history were reviewed and updated as appropriate: allergies, current medications, past family history, past medical history, past social history, past surgical history and problem list.   Objective:  There were no vitals filed for this visit.  Fetal Status:           General:  Alert, oriented and cooperative. Patient is in no acute distress.  Skin: Skin is warm and dry. No rash noted.   Cardiovascular: Normal heart rate noted  Respiratory: Normal respiratory effort, no problems with respiration noted  Abdomen: Soft, gravid, appropriate for gestational age.        Pelvic: Cervical exam deferred        Extremities: Normal range of motion.     Mental Status: Normal mood and affect. Normal behavior. Normal judgment and thought content.   Assessment and Plan:  Pregnancy: [id number] at [redacted]w[redacted]d  1. Supervision of high risk pregnancy, antepartum (Primary) Initial labs drawn. Continue prenatal vitamins. Genetic Screening discussed: NIPS, carrier screening and AFP *** Ultrasound discussed; fetal anatomic survey: *** Problem list reviewed and updated. Reviewed Brx optimized schedule, patient agreeable The nature of [facility] with multiple MDs and other Advanced Practice Providers was explained to patient; also  emphasized that residents, students are part of our team. Routine obstetric precautions reviewed.  Cervical cancer screening not indicated due to age  75. [redacted] weeks gestation of pregnancy Anticipatory guidance regarding future visits/weeks of pregnancy given  3. History of gestational hypertension Start ASA at [redacted] weeks GA  4. History of gestational diabetes Early 2-hour GTT  5. HSV-2 (herpes simplex virus 2) infection ***  Preterm labor symptoms and general obstetric precautions including but not limited to vaginal bleeding, contractions, leaking of fluid and fetal movement were reviewed in detail with the patient.  Please refer to After Visit Summary for other counseling recommendations.   No follow-ups on file.  Future Appointments  Date Time Provider Department Center  03/11/2024  2:15 PM Christean Leaf [facility]  04/15/2024  9:00 AM CENTERING PROVIDER [facility]  05/02/2024 10:00 AM [facility] NURSE [facility]  05/02/2024 10:15 AM [facility] PROVIDER 1 [facility]  05/02/2024 10:30 AM [facility] US5 [facility]  05/13/2024  9:00 AM CENTERING PROVIDER [facility]  06/10/2024  9:00 AM CENTERING PROVIDER [facility]  06/24/2024  9:00 AM CENTERING PROVIDER [facility]  07/08/2024  9:00 AM CENTERING PROVIDER [facility]  07/22/2024  9:00 AM CENTERING PROVIDER [facility]  08/05/2024  9:00 AM CENTERING PROVIDER [facility]  08/19/2024  9:00 AM CENTERING PROVIDER [facility]  09/02/2024  9:00 AM CENTERING PROVIDER [facility]  09/16/2024  9:00 AM CENTERING PROVIDER [facility]    Ralene Muskrat, PA-C

## 2024-03-11 ENCOUNTER — Ambulatory Visit (INDEPENDENT_AMBULATORY_CARE_PROVIDER_SITE_OTHER): Admitting: Physician Assistant

## 2024-03-11 ENCOUNTER — Other Ambulatory Visit: Payer: Self-pay

## 2024-03-11 VITALS — BP 124/74 | HR 85 | Wt 195.0 lb

## 2024-03-11 DIAGNOSIS — O0991 Supervision of high risk pregnancy, unspecified, first trimester: Secondary | ICD-10-CM

## 2024-03-11 DIAGNOSIS — R110 Nausea: Secondary | ICD-10-CM

## 2024-03-11 DIAGNOSIS — O0990 Supervision of high risk pregnancy, unspecified, unspecified trimester: Secondary | ICD-10-CM

## 2024-03-11 DIAGNOSIS — Z3A11 11 weeks gestation of pregnancy: Secondary | ICD-10-CM

## 2024-03-11 DIAGNOSIS — Z8632 Personal history of gestational diabetes: Secondary | ICD-10-CM | POA: Diagnosis not present

## 2024-03-11 DIAGNOSIS — Z8759 Personal history of other complications of pregnancy, childbirth and the puerperium: Secondary | ICD-10-CM

## 2024-03-11 DIAGNOSIS — B009 Herpesviral infection, unspecified: Secondary | ICD-10-CM

## 2024-03-11 MED ORDER — DOXYLAMINE-PYRIDOXINE 10-10 MG PO TBEC: 2.0000 | DELAYED_RELEASE_TABLET | Freq: Every day | ORAL | 1 refills | Status: DC

## 2024-03-11 MED ORDER — ASPIRIN 81 MG PO TBEC: 81.0000 mg | DELAYED_RELEASE_TABLET | Freq: Every day | ORAL | 12 refills | Status: DC

## 2024-03-12 LAB — COMPREHENSIVE METABOLIC PANEL
ALT: 8 IU/L (ref 0–32)
AST: 15 IU/L (ref 0–40)
Albumin: 4.4 g/dL (ref 4.0–5.0)
Alkaline Phosphatase: 77 IU/L (ref 42–106)
BUN/Creatinine Ratio: 5 — ABNORMAL LOW (ref 9–23)
BUN: 4 mg/dL — ABNORMAL LOW (ref 6–20)
Bilirubin Total: 0.3 mg/dL (ref 0.0–1.2)
CO2: 21 mmol/L (ref 20–29)
Calcium: 9.2 mg/dL (ref 8.7–10.2)
Chloride: 103 mmol/L (ref 96–106)
Creatinine, Ser: 0.75 mg/dL (ref 0.57–1.00)
Globulin, Total: 2.4 g/dL (ref 1.5–4.5)
Glucose: 91 mg/dL (ref 70–99)
Potassium: 4 mmol/L (ref 3.5–5.2)
Sodium: 136 mmol/L (ref 134–144)
Total Protein: 6.8 g/dL (ref 6.0–8.5)
eGFR: 118 mL/min/{1.73_m2} (ref >59)

## 2024-03-12 LAB — PROTEIN / CREATININE RATIO, URINE
Creatinine, Urine: 317.9 mg/dL
Protein, Ur: 15.1 mg/dL
Protein/Creat Ratio: 47 mg/g{creat} (ref 0–200)

## 2024-03-12 LAB — HEMOGLOBIN A1C
Est. average glucose Bld gHb Est-mCnc: 108 mg/dL
Hgb A1c MFr Bld: 5.4 % (ref 4.8–5.6)

## 2024-03-12 LAB — TSH: TSH: 0.404 u[IU]/mL — ABNORMAL LOW (ref 0.450–4.500)

## 2024-03-13 NOTE — BH Specialist Note (Signed)
 Pt did not arrive to video visit and did not answer the phone; Unable to leave message as voicemail is not set up; left MyChart message for patient.

## 2024-03-14 ENCOUNTER — Ambulatory Visit

## 2024-03-14 DIAGNOSIS — Z91199 Patient's noncompliance with other medical treatment and regimen due to unspecified reason: Secondary | ICD-10-CM

## 2024-03-24 ENCOUNTER — Inpatient Hospital Stay (HOSPITAL_COMMUNITY)
Admission: AD | Admit: 2024-03-24 | Discharge: 2024-03-25 | Disposition: A | Discharge status: Home / Self Care | Attending: Obstetrics and Gynecology | Admitting: Obstetrics and Gynecology

## 2024-03-24 DIAGNOSIS — O26899 Other specified pregnancy related conditions, unspecified trimester: Secondary | ICD-10-CM

## 2024-03-24 DIAGNOSIS — R102 Pelvic and perineal pain: Secondary | ICD-10-CM | POA: Diagnosis not present

## 2024-03-24 DIAGNOSIS — O26891 Other specified pregnancy related conditions, first trimester: Secondary | ICD-10-CM | POA: Insufficient documentation

## 2024-03-24 DIAGNOSIS — Z3A13 13 weeks gestation of pregnancy: Secondary | ICD-10-CM | POA: Diagnosis not present

## 2024-03-24 DIAGNOSIS — R002 Palpitations: Secondary | ICD-10-CM | POA: Diagnosis not present

## 2024-03-24 DIAGNOSIS — R109 Unspecified abdominal pain: Secondary | ICD-10-CM | POA: Diagnosis present

## 2024-03-24 LAB — URINALYSIS, ROUTINE W REFLEX MICROSCOPIC
Bilirubin Urine: NEGATIVE
Glucose, UA: NEGATIVE mg/dL
Hgb urine dipstick: NEGATIVE
Ketones, ur: NEGATIVE mg/dL
Leukocytes,Ua: NEGATIVE
Nitrite: NEGATIVE
Protein, ur: NEGATIVE mg/dL
Specific Gravity, Urine: 1.009 (ref 1.005–1.030)
pH: 7 (ref 5.0–8.0)

## 2024-03-24 LAB — CBC WITH DIFFERENTIAL/PLATELET
Abs Immature Granulocytes: 0.01 10*3/uL (ref 0.00–0.07)
Basophils Absolute: 0 10*3/uL (ref 0.0–0.1)
Basophils Relative: 0 %
Eosinophils Absolute: 0.4 10*3/uL (ref 0.0–0.5)
Eosinophils Relative: 7 %
HCT: 33.5 % — ABNORMAL LOW (ref 36.0–46.0)
Hemoglobin: 11.5 g/dL — ABNORMAL LOW (ref 12.0–15.0)
Immature Granulocytes: 0 %
Lymphocytes Relative: 30 %
Lymphs Abs: 1.7 10*3/uL (ref 0.7–4.0)
MCH: 27.3 pg (ref 26.0–34.0)
MCHC: 34.3 g/dL (ref 30.0–36.0)
MCV: 79.6 fL — ABNORMAL LOW (ref 80.0–100.0)
Monocytes Absolute: 0.5 10*3/uL (ref 0.1–1.0)
Monocytes Relative: 8 %
Neutro Abs: 3 10*3/uL (ref 1.7–7.7)
Neutrophils Relative %: 55 %
Platelets: 289 10*3/uL (ref 150–400)
RBC: 4.21 MIL/uL (ref 3.87–5.11)
RDW: 13.6 % (ref 11.5–15.5)
WBC: 5.6 10*3/uL (ref 4.0–10.5)
nRBC: 0 % (ref 0.0–0.2)

## 2024-03-24 LAB — WET PREP, GENITAL
Clue Cells Wet Prep HPF POC: NONE SEEN
Sperm: NONE SEEN
Trich, Wet Prep: NONE SEEN
WBC, Wet Prep HPF POC: >=10 — AB (ref <10)
Yeast Wet Prep HPF POC: NONE SEEN

## 2024-03-24 NOTE — MAU Note (Signed)
.  Chelsea Herman is a 18 y.o. at [redacted]w[redacted]d here in MAU reporting: pt reports with feeling heart racing with minimal exertion with causes her to feel short of breath, and sharp stabbing pain R and L lower abdomen intermittent in frequency.  Began 2 days ago.  Denies vaginal bleeding , discharge or odor. Denies pain, foul odor or difficulty with urination. Receives care from Center for [facility]  Onset of complaint: 03/22/2024 Pain score: 7 Vitals:   03/24/24 2034 03/24/24 2042  BP:  116/74  Pulse:  83  Resp:  16  SpO2: 100% 99%     FHT:  unable to obtain FHR pt unable to tolerate pressure of doppler probe Lab orders placed from triage: UA

## 2024-03-24 NOTE — MAU Provider Note (Signed)
 Chief Complaint: Abdominal Pain   Event Date/Time   First Provider Initiated Contact with Patient 03/24/24 2246     SUBJECTIVE HPI: Chelsea Herman is a 18 y.o. [id number] at [redacted]w[redacted]d who presents to Maternity Admissions reporting episodes of rapid heart rate which causes her to feel short of breath x 1 week and sharp bilateral lower abdominal pain x 2 days.  Chelsea Herman is a 18 y.o. at [redacted]w[redacted]d here in MAU reporting: pt reports with feeling heart racing with minimal exertion with causes her to feel short of breath, and sharp stabbing pain R and L lower abdomen intermittent in frequency.  Began 2 days ago.  Denies vaginal bleeding , discharge or odor. Denies pain, foul odor or difficulty with urination. Receives care from Center for [facility]   Pain Location: Bilateral low abdominal Quality: Lambert Mody Severity: 7/10 on pain scale Duration: 2 days Context: [redacted] weeks gestation.  Live IUP previously confirmed by ultrasound. Timing: Intermittent Modifying factors: Worse with movement. Associated signs and symptoms: Neg for fever, chills, N/V/D/C, urinary complaints, vaginal bleeding or vaginal discharge.    Past Medical History:  Diagnosis Date   Asthma    as a child-no current medications or treatments   Diabetes mellitus without complication (HCC)    Gestational diabetes    History of gestational hypertension 02/04/2021   Hypertension    Migraines    OB History  Gravida Para Term Preterm AB Living  4 2 2  0 1 2  SAB IAB Ectopic Multiple Live Births  1 0 0 0 2    # Outcome Date GA Lbr Len/2nd Weight Sex Type Anes PTL Lv  4 Current           3 SAB 11/13/23 103w0d         2 Term 01/02/23 [redacted]w[redacted]d 01:00 3600 g M Vag-Spont None  LIV     Birth Comments: GDM  1 Term 02/04/21 [redacted]w[redacted]d 03:34 / 00:31 3464 g F Vag-Spont EPI  LIV     Birth Comments: GHTN   Past Surgical History:  Procedure Laterality Date   NO PAST SURGERIES     Social History   Socioeconomic History   Marital status: Single    Spouse  name: Not on file   Number of children: Not on file   Years of education: Not on file   Highest education level: Not on file  Occupational History   Not on file  Tobacco Use   Smoking status: Never    Passive exposure: Never   Smokeless tobacco: Never  Vaping Use   Vaping status: Never Used  Substance and Sexual Activity   Alcohol use: Never   Drug use: Never   Sexual activity: Yes    Partners: Male    Birth control/protection: None  Other Topics Concern   Not on file  Social History Narrative   ** Merged History Encounter **       Social Drivers of Health   Financial Resource Strain: Not on file  Food Insecurity: No Food Insecurity (03/11/2024)   Hunger Vital Sign    Worried About Running Out of Food in the Last Year: Never true    Ran Out of Food in the Last Year: Never true  Transportation Needs: No Transportation Needs (03/11/2024)   PRAPARE - Administrator, Civil Service (Medical): No    Lack of Transportation (Non-Medical): No  Physical Activity: Not on file  Stress: Not on file  Social Connections: Not on file  Intimate  Partner Violence: Not on file   Family History  Problem Relation Age of Onset   Hearing loss Mother    Asthma Neg Hx    Cancer Neg Hx    Diabetes Neg Hx    Stroke Neg Hx    No current facility-administered medications on file prior to encounter.   Current Outpatient Medications on File Prior to Encounter  Medication Sig Dispense Refill   aspirin EC 81 MG tablet Take 1 tablet (81 mg total) by mouth daily. Swallow whole. 30 tablet 12   Blood Pressure Monitoring (BLOOD PRESSURE KIT) DEVI 1 Device by Does not apply route as needed. 1 each 0   Doxylamine-Pyridoxine (DICLEGIS) 10-10 MG TBEC Take 2 tablets by mouth at bedtime. If symptoms persist, add one tablet in the morning and one in the afternoon 60 tablet 1   EPINEPHrine 0.3 mg/0.3 mL IJ SOAJ injection Inject 0.3 mg into the muscle as needed (peanut allerfy). (Patient not taking:  Reported on 03/11/2024)     Iron Combinations (IRON COMPLEX PO) Take 1 tablet by mouth daily at 6 (six) AM.     Omega-3 Fatty Acids (OMEGA-3 FISH OIL PO) Take 2 capsules by mouth daily at 6 (six) AM.     ondansetron (ZOFRAN-ODT) 4 MG disintegrating tablet Take 1 tablet (4 mg total) by mouth every 6 (six) hours as needed for nausea. (Patient not taking: Reported on 03/11/2024) 20 tablet 0   Prenatal Vit-Fe Fumarate-FA (PREPLUS) 27-1 MG TABS Take 1 tablet by mouth daily. 30 tablet 13   promethazine (PHENERGAN) 25 MG tablet Take 1 tablet (25 mg total) by mouth every 6 (six) hours as needed for nausea or vomiting. (Patient not taking: Reported on 03/11/2024) 30 tablet 2   terconazole (TERAZOL 7) 0.4 % vaginal cream Place 1 applicator vaginally at bedtime. Use for seven days (Patient not taking: Reported on 03/11/2024) 45 g 0   Vitamin D-Vitamin K (VITAMIN D2 + K1 PO) Take 1 tablet by mouth daily at 6 (six) AM. Vitamin K2, D3 actual name     [DISCONTINUED] albuterol (PROVENTIL HFA;VENTOLIN HFA) 108 (90 Base) MCG/ACT inhaler Inhale 2 puffs into the lungs every 4 (four) hours as needed for wheezing or shortness of breath. 1 Inhaler 1   [DISCONTINUED] cetirizine (ZYRTEC) 5 MG tablet Take 1 tablet (5 mg total) by mouth daily. 7 tablet 0   Allergies  Allergen Reactions   Peanut-Containing Drug Products Anaphylaxis   Citric Acid Rash    I have reviewed patient's Past Medical Hx, Surgical Hx, Family Hx, Social Hx, medications and allergies.   Review of Systems  All other systems reviewed and are negative.   OBJECTIVE Patient Vitals for the past 24 hrs:  BP Temp src Pulse Resp SpO2 Height Weight  03/25/24 0031 121/68 -- (!) 58 17 100 % -- --  03/24/24 2305 -- -- -- -- 100 % -- --  03/24/24 2300 -- -- -- -- 100 % -- --  03/24/24 2255 -- -- -- -- 100 % -- --  03/24/24 2250 -- -- -- -- 99 % -- --  03/24/24 2245 -- -- -- -- 100 % -- --  03/24/24 2240 -- -- -- -- 100 % -- --  03/24/24 2235 -- -- -- -- 100  % -- --  03/24/24 2230 -- -- -- -- (!) 79 % -- --  03/24/24 2225 -- -- -- -- 100 % -- --  03/24/24 2042 116/74 Oral 83 16 99 % 5\' 5"  (1.651 m) 90.6  kg  03/24/24 2034 -- -- -- -- 100 % -- --   Constitutional: Well-developed, well-nourished female in no acute distress.  Cardiovascular: normal rate Respiratory: normal rate and effort.  GI: Abd soft, +guarding in left groin other wise non-tender, gravid appropriate for gestational age.  MS: Extremities nontender, no edema, normal ROM Neurologic: Alert and oriented x 4.  GU: PELVIC EXAM: NEFG, physiologic discharge, no blood noted, cervix long and closed. No CMT.  LAB RESULTS Results for orders placed or performed during the hospital encounter of 03/24/24 (from the past 24 hours)  Urinalysis, Routine w reflex microscopic -Urine, Clean Catch     Status: Abnormal   Collection Time: 03/24/24  8:51 PM  Result Value Ref Range   Color, Urine STRAW (A) YELLOW   APPearance CLEAR CLEAR   Specific Gravity, Urine 1.009 1.005 - 1.030   pH 7.0 5.0 - 8.0   Glucose, UA NEGATIVE NEGATIVE mg/dL   Hgb urine dipstick NEGATIVE NEGATIVE   Bilirubin Urine NEGATIVE NEGATIVE   Ketones, ur NEGATIVE NEGATIVE mg/dL   Protein, ur NEGATIVE NEGATIVE mg/dL   Nitrite NEGATIVE NEGATIVE   Leukocytes,Ua NEGATIVE NEGATIVE  CBC with Differential/Platelet     Status: Abnormal   Collection Time: 03/24/24  9:08 PM  Result Value Ref Range   WBC 5.6 4.0 - 10.5 K/uL   RBC 4.21 3.87 - 5.11 MIL/uL   Hemoglobin 11.5 (L) 12.0 - 15.0 g/dL   HCT 82.9 (L) 56.2 - 13.0 %   MCV 79.6 (L) 80.0 - 100.0 fL   MCH 27.3 26.0 - 34.0 pg   MCHC 34.3 30.0 - 36.0 g/dL   RDW 86.5 78.4 - 69.6 %   Platelets 289 150 - 400 K/uL   nRBC 0.0 0.0 - 0.2 %   Neutrophils Relative % 55 %   Neutro Abs 3.0 1.7 - 7.7 K/uL   Lymphocytes Relative 30 %   Lymphs Abs 1.7 0.7 - 4.0 K/uL   Monocytes Relative 8 %   Monocytes Absolute 0.5 0.1 - 1.0 K/uL   Eosinophils Relative 7 %   Eosinophils Absolute 0.4 0.0  - 0.5 K/uL   Basophils Relative 0 %   Basophils Absolute 0.0 0.0 - 0.1 K/uL   Immature Granulocytes 0 %   Abs Immature Granulocytes 0.01 0.00 - 0.07 K/uL  Wet prep, genital     Status: Abnormal   Collection Time: 03/24/24 11:12 PM   Specimen: Vaginal  Result Value Ref Range   Yeast Wet Prep HPF POC NONE SEEN NONE SEEN   Trich, Wet Prep NONE SEEN NONE SEEN   Clue Cells Wet Prep HPF POC NONE SEEN NONE SEEN   WBC, Wet Prep HPF POC >=10 (A) <10   Sperm NONE SEEN     IMAGING ECG Nml per cardiology   MAU COURSE Orders Placed This Encounter  Procedures   Wet prep, genital   Urinalysis, Routine w reflex microscopic -Urine, Clean Catch   CBC with Differential/Platelet   Lab instructions   Pulse oximetry (single)   EKG 12-Lead   Discharge patient   No orders of the defined types were placed in this encounter.   MDM - 18 year old female at 13 weeks 4 days gestation with 1 week of subjective tachycardia causing sensation of shortness of breath.  Not present during MAU visit.  EKG normal per cardiologist showed sinus arrhythmia.  No tachycardia on continuous pulse ox.  Oxygen saturations 99% on room air.  No evidence of cardiopulmonary emergency.  Low suspicion for  PE due to absence of current symptoms, absence of tachycardia.  - Bilateral lower abdominal pain likely due to round ligament pain.  No evidence of infectious cause of pain: No fever or leukocytosis, UA negative.  wet prep negative.  GC/chlamydia cultures pending.  Cervix closed and long.  Discussed safe medications and comfort measures.  Abdominal pain and pregnancy precautions reviewed.  ASSESSMENT 1. Pain of round ligament during pregnancy   2. [redacted] weeks gestation of pregnancy   3. Palpitation   4. Abdominal pain during pregnancy, first trimester     PLAN Discharge home in stable condition. Abdominal pain in pregnancy precautions Cardiac red flags reviewed. Ambulatory referral to cardio obstetrics  Follow-up  Information     [facility] for [facility] Follow up.   Specialty: Obstetrics and Gynecology Why: Routine prenatal visit Contact information: [address] [place] [address] [phone]        Thomasene Ripple, DO Follow up.   Specialty: Cardiology Why: Will call to schedule cardiology appointment Contact information: [address] [place] [address] [place] [address] [phone]         [facility]S Maternity Assessment Unit Follow up.   Specialty: Obstetrics and Gynecology Why: As needed if symptoms worsen Contact information: [address] [place] [address] [phone]               Allergies as of 03/25/2024       Reactions   Peanut-containing Drug Products Anaphylaxis   Citric Acid Rash        Medication List     TAKE these medications    aspirin EC 81 MG tablet Take 1 tablet (81 mg total) by mouth daily. Swallow whole.   Blood Pressure Kit Devi 1 Device by Does not apply route as needed.   Doxylamine-Pyridoxine 10-10 MG Tbec Commonly known as: Diclegis Take 2 tablets by mouth at bedtime. If symptoms persist, add one tablet in the morning and one in the afternoon   EPINEPHrine 0.3 mg/0.3 mL Soaj injection Commonly known as: EPI-PEN Inject 0.3 mg into the muscle as needed (peanut allerfy).   IRON COMPLEX PO Take 1 tablet by mouth daily at 6 (six) AM.   OMEGA-3 FISH OIL PO Take 2 capsules by mouth daily at 6 (six) AM.   ondansetron 4 MG disintegrating tablet Commonly known as: ZOFRAN-ODT Take 1 tablet (4 mg total) by mouth every 6 (six) hours as needed for nausea.   PrePLUS 27-1 MG Tabs Take 1 tablet by mouth daily.   promethazine 25 MG tablet Commonly known as: PHENERGAN Take 1 tablet (25 mg total) by mouth every 6 (six) hours as needed for nausea or vomiting.   terconazole 0.4 % vaginal cream Commonly known as: TERAZOL 7 Place 1 applicator vaginally at  bedtime. Use for seven days   VITAMIN D2 + K1 PO Take 1 tablet by mouth daily at 6 (six) AM. Vitamin K2, D3 actual name         Dorathy Kinsman, CNM 03/25/2024  3:05 AM

## 2024-03-25 ENCOUNTER — Other Ambulatory Visit: Payer: Self-pay | Admitting: Advanced Practice Midwife

## 2024-03-25 DIAGNOSIS — R0602 Shortness of breath: Secondary | ICD-10-CM

## 2024-03-25 DIAGNOSIS — R002 Palpitations: Secondary | ICD-10-CM

## 2024-03-25 LAB — GC/CHLAMYDIA PROBE AMP (~~LOC~~) NOT AT ARMC
Chlamydia: NEGATIVE
Comment: NEGATIVE
Comment: NORMAL
Neisseria Gonorrhea: NEGATIVE

## 2024-03-29 ENCOUNTER — Encounter: Payer: Self-pay | Admitting: Advanced Practice Midwife

## 2024-03-29 DIAGNOSIS — R000 Tachycardia, unspecified: Secondary | ICD-10-CM | POA: Insufficient documentation

## 2024-04-02 ENCOUNTER — Inpatient Hospital Stay (HOSPITAL_COMMUNITY)

## 2024-04-02 ENCOUNTER — Encounter (HOSPITAL_COMMUNITY): Payer: Self-pay | Admitting: Obstetrics and Gynecology

## 2024-04-02 ENCOUNTER — Inpatient Hospital Stay (HOSPITAL_COMMUNITY)
Admission: AD | Admit: 2024-04-02 | Discharge: 2024-04-03 | Disposition: A | Discharge status: Home / Self Care | Payer: Self-pay | Attending: Obstetrics and Gynecology | Admitting: Obstetrics and Gynecology

## 2024-04-02 DIAGNOSIS — O9A212 Injury, poisoning and certain other consequences of external causes complicating pregnancy, second trimester: Secondary | ICD-10-CM | POA: Diagnosis present

## 2024-04-02 DIAGNOSIS — W51XXXA Accidental striking against or bumped into by another person, initial encounter: Secondary | ICD-10-CM | POA: Diagnosis not present

## 2024-04-02 DIAGNOSIS — Z3A14 14 weeks gestation of pregnancy: Secondary | ICD-10-CM | POA: Insufficient documentation

## 2024-04-02 DIAGNOSIS — H538 Other visual disturbances: Secondary | ICD-10-CM | POA: Diagnosis not present

## 2024-04-02 DIAGNOSIS — O26892 Other specified pregnancy related conditions, second trimester: Secondary | ICD-10-CM | POA: Diagnosis not present

## 2024-04-02 DIAGNOSIS — O209 Hemorrhage in early pregnancy, unspecified: Secondary | ICD-10-CM

## 2024-04-02 DIAGNOSIS — R519 Headache, unspecified: Secondary | ICD-10-CM

## 2024-04-02 DIAGNOSIS — S0990XA Unspecified injury of head, initial encounter: Secondary | ICD-10-CM | POA: Diagnosis not present

## 2024-04-02 DIAGNOSIS — O0990 Supervision of high risk pregnancy, unspecified, unspecified trimester: Secondary | ICD-10-CM

## 2024-04-02 MED ORDER — ACETAMINOPHEN-CAFFEINE 500-65 MG PO TABS
2.0000 | ORAL_TABLET | Freq: Once | ORAL | Status: AC
  Administered 2024-04-02: 2 via ORAL
  Filled 2024-04-02: qty 2

## 2024-04-02 NOTE — MAU Provider Note (Signed)
 Chief Complaint: Head Injury   Event Date/Time   First Provider Initiated Contact with Patient 04/02/24 2226        SUBJECTIVE HPI: Chelsea Herman is a 18 y.o. [id number] at [redacted]w[redacted]d by LMP who presents to maternity admissions reporting hitting her head yesterday with onset of nosebleed yesterday and headache with blurred vision  today. States her child knocked his head into hers, hitting upper left forehead.  States feels a crack there.  States has headache and blurred vision. No other deficits. .DId not take anything for the pain  Head Injury  The incident occurred 12 to 24 hours ago. The injury mechanism was a direct blow. There was no loss of consciousness. There was no blood loss. The quality of the pain is described as aching and dull. The pain has been constant since the injury. Associated symptoms include blurred vision. Pertinent negatives include no disorientation, numbness or vomiting. She has tried nothing for the symptoms.   RN Note: Chelsea Herman is a 18 y.o. at [redacted]w[redacted]d here in MAU reporting being hit twice in the same location on her head since Monday. Monday her small child fell backward and his head hit her head. She reports a nose bleed Monday night. Today about 1430 her child handed her a gaming headset and accidentally hit her in the same spot on her head as the injury from Monday. Pt reports having some visual changes and a headache. Both eyes have blurry vision and are "sensitive". The hits were on the left side of her forehead. Pt states can feel a "crack" at the area where she was hit. Has not taken any medication for the pain. Denies VB   Past Medical History:  Diagnosis Date   Asthma    as a child-no current medications or treatments   Diabetes mellitus without complication (HCC)    Gestational diabetes    History of gestational hypertension 02/04/2021   Hypertension    Migraines    Past Surgical History:  Procedure Laterality Date   NO PAST SURGERIES     Social History    Socioeconomic History   Marital status: Single    Spouse name: Not on file   Number of children: Not on file   Years of education: Not on file   Highest education level: Not on file  Occupational History   Not on file  Tobacco Use   Smoking status: Never    Passive exposure: Never   Smokeless tobacco: Never  Vaping Use   Vaping status: Never Used  Substance and Sexual Activity   Alcohol use: Never   Drug use: Never   Sexual activity: Yes    Partners: Male    Birth control/protection: None  Other Topics Concern   Not on file  Social History Narrative   ** Merged History Encounter **       Social Drivers of Health   Financial Resource Strain: Not on file  Food Insecurity: No Food Insecurity (03/11/2024)   Hunger Vital Sign    Worried About Running Out of Food in the Last Year: Never true    Ran Out of Food in the Last Year: Never true  Transportation Needs: No Transportation Needs (03/11/2024)   PRAPARE - Administrator, Civil Service (Medical): No    Lack of Transportation (Non-Medical): No  Physical Activity: Not on file  Stress: Not on file  Social Connections: Not on file  Intimate Partner Violence: Not on file   No current facility-administered  medications on file prior to encounter.   Current Outpatient Medications on File Prior to Encounter  Medication Sig Dispense Refill   aspirin EC 81 MG tablet Take 1 tablet (81 mg total) by mouth daily. Swallow whole. 30 tablet 12   Iron Combinations (IRON COMPLEX PO) Take 1 tablet by mouth daily at 6 (six) AM.     Omega-3 Fatty Acids (OMEGA-3 FISH OIL PO) Take 2 capsules by mouth daily at 6 (six) AM.     Prenatal Vit-Fe Fumarate-FA (PREPLUS) 27-1 MG TABS Take 1 tablet by mouth daily. 30 tablet 13   Vitamin D-Vitamin K (VITAMIN D2 + K1 PO) Take 1 tablet by mouth daily at 6 (six) AM. Vitamin K2, D3 actual name     Blood Pressure Monitoring (BLOOD PRESSURE KIT) DEVI 1 Device by Does not apply route as needed. 1  each 0   Doxylamine-Pyridoxine (DICLEGIS) 10-10 MG TBEC Take 2 tablets by mouth at bedtime. If symptoms persist, add one tablet in the morning and one in the afternoon (Patient not taking: Reported on 04/02/2024) 60 tablet 1   EPINEPHrine 0.3 mg/0.3 mL IJ SOAJ injection Inject 0.3 mg into the muscle as needed (peanut allerfy). (Patient not taking: Reported on 03/11/2024)     ondansetron (ZOFRAN-ODT) 4 MG disintegrating tablet Take 1 tablet (4 mg total) by mouth every 6 (six) hours as needed for nausea. (Patient not taking: Reported on 03/11/2024) 20 tablet 0   promethazine (PHENERGAN) 25 MG tablet Take 1 tablet (25 mg total) by mouth every 6 (six) hours as needed for nausea or vomiting. (Patient not taking: Reported on 03/11/2024) 30 tablet 2   terconazole (TERAZOL 7) 0.4 % vaginal cream Place 1 applicator vaginally at bedtime. Use for seven days (Patient not taking: Reported on 03/11/2024) 45 g 0   [DISCONTINUED] albuterol (PROVENTIL HFA;VENTOLIN HFA) 108 (90 Base) MCG/ACT inhaler Inhale 2 puffs into the lungs every 4 (four) hours as needed for wheezing or shortness of breath. 1 Inhaler 1   [DISCONTINUED] cetirizine (ZYRTEC) 5 MG tablet Take 1 tablet (5 mg total) by mouth daily. 7 tablet 0   Allergies  Allergen Reactions   Peanut-Containing Drug Products Anaphylaxis   Citric Acid Rash    I have reviewed patient's Past Medical Hx, Surgical Hx, Family Hx, Social Hx, medications and allergies.   ROS:  Review of Systems  Eyes:  Positive for blurred vision.  Gastrointestinal:  Negative for vomiting.  Neurological:  Negative for numbness.   Review of Systems  Other systems negative   Physical Exam  Physical Exam Patient Vitals for the past 24 hrs:  BP Temp Pulse Resp SpO2 Height Weight  04/02/24 2206 124/77 97.6 F (36.4 C) 86 17 100 % 5\' 5"  (1.651 m) 89.4 kg   Constitutional: Well-developed, well-nourished female in no acute distress.  Cardiovascular: normal rate Respiratory: normal  effort GI: Abd soft, non-tender.  MS: Extremities nontender, no edema, normal ROM Neurologic: Alert and oriented x 4. Moves all extremities well and equallly   No speech impediment.  No facial drooping.Marland Kitchen  Speech pattern normal   FHT 154 by doppler  LAB RESULTS No results found for this or any previous visit (from the past 24 hours).  A/Positive/-- (02/26 0932)  IMAGING Head CT Normal    MAU Management/MDM: I have reviewed the triage vital signs and the nursing notes.   Pertinent labs & imaging results that were available during my care of the patient were reviewed by me and considered in my medical  decision making (see chart for details).      I have reviewed her medical records including past results, notes and treatments. Medical, Surgical, and family history were reviewed.  Medications and recent lab tests were reviewed  Ordered CT head without contrast..>> Normal   Consult Dr Derry Lory with presentation, exam findings, and results.  He recommends CT as above.  States if this does not show fracture, treat as concussion with fluids, analgesia and rest Treatments in MAU included Excedrin Tension.    ASSESSMENT Single IUP at [redacted]w[redacted]d Head Trauma Headache and blurred vision, likely concussion  PLAN Discharge home Advise 24 hrs of fluids, rest, and no screens Follow up in office  Pt stable at time of discharge. Encouraged to return here if she develops worsening of symptoms, increase in pain, fever, or other concerning symptoms.    Wynelle Bourgeois CNM, MSN Certified Nurse-Midwife 04/02/2024  10:26 PM
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
## 2024-04-03 ENCOUNTER — Encounter: Payer: Self-pay | Admitting: Advanced Practice Midwife

## 2024-04-03 DIAGNOSIS — O26892 Other specified pregnancy related conditions, second trimester: Secondary | ICD-10-CM

## 2024-04-03 DIAGNOSIS — R519 Headache, unspecified: Secondary | ICD-10-CM

## 2024-04-03 DIAGNOSIS — S0990XA Unspecified injury of head, initial encounter: Secondary | ICD-10-CM

## 2024-04-03 DIAGNOSIS — Z3A14 14 weeks gestation of pregnancy: Secondary | ICD-10-CM

## 2024-04-03 NOTE — Progress Notes (Signed)
 Written and verbal d/c instructions given and understanding voiced.

## 2024-04-11 ENCOUNTER — Encounter: Payer: Self-pay | Admitting: *Deleted

## 2024-04-11 ENCOUNTER — Telehealth: Payer: Self-pay | Admitting: *Deleted

## 2024-04-11 NOTE — Telephone Encounter (Signed)
 I called Chelsea Herman to review her upcoming CenteringPregnancy appointment and information. I was unable  to reach her or leave a message. Will send MyChart message. Adis Sturgill,RN

## 2024-04-15 ENCOUNTER — Ambulatory Visit: Payer: Medicaid Other | Admitting: Advanced Practice Midwife

## 2024-04-15 ENCOUNTER — Other Ambulatory Visit: Payer: Self-pay

## 2024-04-15 ENCOUNTER — Encounter: Payer: Self-pay | Admitting: *Deleted

## 2024-04-15 VITALS — BP 117/76 | HR 86 | Wt 199.4 lb

## 2024-04-15 DIAGNOSIS — Z3A16 16 weeks gestation of pregnancy: Secondary | ICD-10-CM

## 2024-04-15 DIAGNOSIS — Z8632 Personal history of gestational diabetes: Secondary | ICD-10-CM | POA: Diagnosis not present

## 2024-04-15 DIAGNOSIS — Z8759 Personal history of other complications of pregnancy, childbirth and the puerperium: Secondary | ICD-10-CM | POA: Diagnosis not present

## 2024-04-15 DIAGNOSIS — O0990 Supervision of high risk pregnancy, unspecified, unspecified trimester: Secondary | ICD-10-CM

## 2024-04-15 DIAGNOSIS — Z3490 Encounter for supervision of normal pregnancy, unspecified, unspecified trimester: Secondary | ICD-10-CM

## 2024-04-15 DIAGNOSIS — Z3492 Encounter for supervision of normal pregnancy, unspecified, second trimester: Secondary | ICD-10-CM

## 2024-04-15 DIAGNOSIS — R7989 Other specified abnormal findings of blood chemistry: Secondary | ICD-10-CM

## 2024-04-15 NOTE — Progress Notes (Signed)
 PRENATAL VISIT NOTE- Centering Pregnancy Cycle 20, Session # 1  Subjective:  Chelsea Herman is a 18 y.o. [id number] at [redacted]w[redacted]d being seen today for ongoing prenatal care through Centering Pregnancy.  She is currently monitored for the following issues for this low-risk pregnancy and has Mild intermittent asthma without complication; Peanut allergy; History of gestational hypertension; History of gestational diabetes; HSV-2 (herpes simplex virus 2) infection; Supervision of high risk pregnancy, antepartum; Vaginal bleeding in pregnancy, first trimester; and Tachycardia on their problem list.  Patient reports no complaints.  Contractions: Not present. Vag. Bleeding: None.   . Denies leaking of fluid/ROM.   The following portions of the patient's history were reviewed and updated as appropriate: allergies, current medications, past family history, past medical history, past social history, past surgical history and problem list. Problem list updated.  Objective:   Vitals:   04/15/24 1140  BP: 117/76  Pulse: 86  Weight: 199 lb 6.4 oz (90.4 kg)    Fetal Status: Fetal Heart Rate (bpm): 151         General:  Alert, oriented and cooperative. Patient is in no acute distress.  Skin: Skin is warm and dry. No rash noted.   Cardiovascular: Normal heart rate noted  Respiratory: Normal respiratory effort, no problems with respiration noted  Abdomen: Soft, gravid, appropriate for gestational age.  Pain/Pressure: Absent     Pelvic: Cervical exam deferred        Extremities: Normal range of motion.  Edema: None  Mental Status: Normal mood and affect. Normal behavior. Normal judgment and thought content.   Assessment and Plan:  Pregnancy: [id number] at [redacted]w[redacted]d  1. Encounter for supervision of low-risk pregnancy, antepartum (Primary) --Anticipatory guidance about next visits/weeks of pregnancy given.   - AFP, Serum, Open Spina Bifida; Future    Centering Pregnancy, Session#1: Introduction to  model of care. Group determined rules for self-governance and closing phrase. Oriented group to space and mother's notebook.   Facilitated discussion today:  common discomforts, When to call practice  Mindfulness activity completed as well as introduction to deep breathing for childbirth preparation- Centering 3 Breaths  Fundal height and FHR appropriate today unless noted otherwise in plan of care. Patient to continue group care.    2. [redacted] weeks gestation of pregnancy   3. History of gestational hypertension --Baseline labs wnl, on BASA  4. History of gestational diabetes --A1C wnl  5. Abnormal TSH --TSH slightly low on 3/17, given tachycardia (pt has OB cardiology referral) will f/u with thyroid labs  - T3, free; Future - T4, free; Future    6. History of miscarriage --Loss at 8 weeks by LMP in 10/2023, pt with anxiety, worries about this pregnancy --Reassurance provided, affirmation for Centering for Group 20 is "I am Ok and my baby is Ok", helping patient with her fears.      Preterm labor symptoms and general obstetric precautions including but not limited to vaginal bleeding, contractions, leaking of fluid and fetal movement were reviewed in detail with the patient. Please refer to After Visit Summary for other counseling recommendations.  Return for Centering Sessions as scheduled.  Future Appointments  Date Time Provider Department Center  05/02/2024 10:00 AM [facility] NURSE [facility]  05/02/2024 10:15 AM [facility] PROVIDER 1 [facility]  05/02/2024 10:30 AM [facility] US5 [facility]  05/13/2024  9:00 AM CENTERING PROVIDER [facility]  05/17/2024  2:20 PM Tobb, Kardie, DO [facility] None  06/10/2024  9:00 AM CENTERING PROVIDER [facility]  [facility]  06/24/2024  9:00 AM CENTERING PROVIDER [facility]  07/08/2024  9:00 AM CENTERING PROVIDER [facility]  07/22/2024  9:00 AM CENTERING PROVIDER [facility]  08/05/2024  9:00 AM CENTERING PROVIDER [facility]  08/19/2024  9:00 AM CENTERING  PROVIDER [facility]  09/02/2024  9:00 AM CENTERING PROVIDER [facility]  09/16/2024  9:00 AM CENTERING PROVIDER [facility]    Arlester Bence, CNM

## 2024-04-15 NOTE — Progress Notes (Signed)
 Completed Pregnancy risk form today. Chelsea Herman

## 2024-04-28 ENCOUNTER — Encounter (HOSPITAL_COMMUNITY): Payer: Self-pay

## 2024-04-28 ENCOUNTER — Other Ambulatory Visit: Payer: Self-pay

## 2024-04-28 ENCOUNTER — Emergency Department (HOSPITAL_BASED_OUTPATIENT_CLINIC_OR_DEPARTMENT_OTHER): Payer: Self-pay

## 2024-04-28 ENCOUNTER — Emergency Department (HOSPITAL_COMMUNITY)
Admission: EM | Admit: 2024-04-28 | Discharge: 2024-04-28 | Disposition: A | Discharge status: Home / Self Care | Attending: Obstetrics & Gynecology | Admitting: Obstetrics & Gynecology

## 2024-04-28 ENCOUNTER — Emergency Department (HOSPITAL_COMMUNITY): Payer: Self-pay

## 2024-04-28 ENCOUNTER — Other Ambulatory Visit (HOSPITAL_COMMUNITY): Payer: Self-pay

## 2024-04-28 DIAGNOSIS — O9A212 Injury, poisoning and certain other consequences of external causes complicating pregnancy, second trimester: Secondary | ICD-10-CM | POA: Insufficient documentation

## 2024-04-28 DIAGNOSIS — O132 Gestational [pregnancy-induced] hypertension without significant proteinuria, second trimester: Secondary | ICD-10-CM | POA: Diagnosis not present

## 2024-04-28 DIAGNOSIS — Z91010 Allergy to peanuts: Secondary | ICD-10-CM | POA: Insufficient documentation

## 2024-04-28 DIAGNOSIS — Z3A18 18 weeks gestation of pregnancy: Secondary | ICD-10-CM | POA: Diagnosis not present

## 2024-04-28 DIAGNOSIS — Z7982 Long term (current) use of aspirin: Secondary | ICD-10-CM | POA: Diagnosis not present

## 2024-04-28 DIAGNOSIS — O24419 Gestational diabetes mellitus in pregnancy, unspecified control: Secondary | ICD-10-CM | POA: Insufficient documentation

## 2024-04-28 DIAGNOSIS — O26892 Other specified pregnancy related conditions, second trimester: Secondary | ICD-10-CM | POA: Diagnosis present

## 2024-04-28 DIAGNOSIS — O9A312 Physical abuse complicating pregnancy, second trimester: Secondary | ICD-10-CM

## 2024-04-28 DIAGNOSIS — R1084 Generalized abdominal pain: Secondary | ICD-10-CM | POA: Diagnosis not present

## 2024-04-28 DIAGNOSIS — R109 Unspecified abdominal pain: Secondary | ICD-10-CM

## 2024-04-28 DIAGNOSIS — S161XXA Strain of muscle, fascia and tendon at neck level, initial encounter: Secondary | ICD-10-CM

## 2024-04-28 DIAGNOSIS — M542 Cervicalgia: Secondary | ICD-10-CM | POA: Insufficient documentation

## 2024-04-28 LAB — CBC WITH DIFFERENTIAL/PLATELET
Abs Immature Granulocytes: 0.01 10*3/uL (ref 0.00–0.07)
Basophils Absolute: 0 10*3/uL (ref 0.0–0.1)
Basophils Relative: 0 %
Eosinophils Absolute: 0.2 10*3/uL (ref 0.0–0.5)
Eosinophils Relative: 3 %
HCT: 33.7 % — ABNORMAL LOW (ref 36.0–46.0)
Hemoglobin: 11.3 g/dL — ABNORMAL LOW (ref 12.0–15.0)
Immature Granulocytes: 0 %
Lymphocytes Relative: 21 %
Lymphs Abs: 1.3 10*3/uL (ref 0.7–4.0)
MCH: 27.6 pg (ref 26.0–34.0)
MCHC: 33.5 g/dL (ref 30.0–36.0)
MCV: 82.4 fL (ref 80.0–100.0)
Monocytes Absolute: 0.4 10*3/uL (ref 0.1–1.0)
Monocytes Relative: 7 %
Neutro Abs: 4.4 10*3/uL (ref 1.7–7.7)
Neutrophils Relative %: 69 %
Platelets: 247 10*3/uL (ref 150–400)
RBC: 4.09 MIL/uL (ref 3.87–5.11)
RDW: 13.2 % (ref 11.5–15.5)
WBC: 6.3 10*3/uL (ref 4.0–10.5)
nRBC: 0 % (ref 0.0–0.2)

## 2024-04-28 LAB — COMPREHENSIVE METABOLIC PANEL WITH GFR
ALT: 10 U/L (ref 0–44)
AST: 15 U/L (ref 15–41)
Albumin: 3.4 g/dL — ABNORMAL LOW (ref 3.5–5.0)
Alkaline Phosphatase: 53 U/L (ref 38–126)
Anion gap: 11 (ref 5–15)
BUN: <5 mg/dL — ABNORMAL LOW (ref 6–20)
CO2: 20 mmol/L — ABNORMAL LOW (ref 22–32)
Calcium: 8.7 mg/dL — ABNORMAL LOW (ref 8.9–10.3)
Chloride: 105 mmol/L (ref 98–111)
Creatinine, Ser: 0.85 mg/dL (ref 0.44–1.00)
GFR, Estimated: >60 mL/min (ref >60)
Glucose, Bld: 97 mg/dL (ref 70–99)
Potassium: 3.4 mmol/L — ABNORMAL LOW (ref 3.5–5.1)
Sodium: 136 mmol/L (ref 135–145)
Total Bilirubin: 0.3 mg/dL (ref 0.0–1.2)
Total Protein: 6.2 g/dL — ABNORMAL LOW (ref 6.5–8.1)

## 2024-04-28 LAB — LIPASE, BLOOD: Lipase: 28 U/L (ref 11–51)

## 2024-04-28 MED ORDER — SODIUM CHLORIDE 0.9 % IV BOLUS: 1000.0000 mL | Freq: Once | INTRAVENOUS | Status: DC

## 2024-04-28 MED ORDER — ACETAMINOPHEN 500 MG PO TABS
1000.0000 mg | ORAL_TABLET | Freq: Once | ORAL | Status: AC
  Administered 2024-04-28: 1000 mg via ORAL
  Filled 2024-04-28: qty 2

## 2024-04-28 MED ORDER — SODIUM CHLORIDE 0.9 % IV BOLUS
1000.0000 mL | Freq: Once | INTRAVENOUS | Status: AC
  Administered 2024-04-28: 1000 mL via INTRAVENOUS
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
## 2024-05-02 ENCOUNTER — Ambulatory Visit: Payer: Medicaid Other

## 2024-05-02 ENCOUNTER — Ambulatory Visit: Payer: Medicaid Other | Attending: Obstetrics and Gynecology

## 2024-05-02 ENCOUNTER — Ambulatory Visit (HOSPITAL_BASED_OUTPATIENT_CLINIC_OR_DEPARTMENT_OTHER): Payer: Medicaid Other | Admitting: Obstetrics

## 2024-05-02 ENCOUNTER — Other Ambulatory Visit: Payer: Self-pay | Admitting: *Deleted

## 2024-05-02 VITALS — BP 129/67 | HR 80

## 2024-05-02 DIAGNOSIS — E669 Obesity, unspecified: Secondary | ICD-10-CM

## 2024-05-02 DIAGNOSIS — Z8759 Personal history of other complications of pregnancy, childbirth and the puerperium: Secondary | ICD-10-CM | POA: Diagnosis present

## 2024-05-02 DIAGNOSIS — Z8632 Personal history of gestational diabetes: Secondary | ICD-10-CM | POA: Diagnosis present

## 2024-05-02 DIAGNOSIS — Z362 Encounter for other antenatal screening follow-up: Secondary | ICD-10-CM

## 2024-05-02 DIAGNOSIS — O0990 Supervision of high risk pregnancy, unspecified, unspecified trimester: Secondary | ICD-10-CM

## 2024-05-02 DIAGNOSIS — O99212 Obesity complicating pregnancy, second trimester: Secondary | ICD-10-CM

## 2024-05-02 DIAGNOSIS — O209 Hemorrhage in early pregnancy, unspecified: Secondary | ICD-10-CM

## 2024-05-02 DIAGNOSIS — Z3A19 19 weeks gestation of pregnancy: Secondary | ICD-10-CM

## 2024-05-02 DIAGNOSIS — O09892 Supervision of other high risk pregnancies, second trimester: Secondary | ICD-10-CM | POA: Diagnosis present

## 2024-05-02 DIAGNOSIS — O09292 Supervision of pregnancy with other poor reproductive or obstetric history, second trimester: Secondary | ICD-10-CM

## 2024-05-02 NOTE — Progress Notes (Signed)
 MFM Consult Note  Gianni Flock is currently at 19 weeks and 0 days.  She was seen due to a high risk teen pregnancy and maternal obesity with a BMI of 32.6.  She denies any significant past medical history and denies any problems in her current pregnancy.    She had a cell free DNA test earlier in her pregnancy which indicated a low risk for trisomy 65, 34, and 13. A female fetus is predicted.  She was informed that the fetal growth and amniotic fluid level were appropriate for her gestational age.   There were no obvious fetal anomalies noted on today's ultrasound exam.  However, today's exam was limited due to the fetal position.  The patient was informed that anomalies may be missed due to technical limitations. If the fetus is in a suboptimal position or maternal habitus is increased, visualization of the fetus in the maternal uterus may be impaired.  A follow-up exam was scheduled in 5 weeks to complete the views of the fetal anatomy and to assess the fetal growth.    The patient stated that all of her questions were answered today.  A total of 30 minutes was spent counseling and coordinating the care for this patient.  Greater than 50% of the time was spent in direct face-to-face contact.

## 2024-05-13 ENCOUNTER — Other Ambulatory Visit: Payer: Self-pay

## 2024-05-13 ENCOUNTER — Ambulatory Visit: Payer: Self-pay | Admitting: Advanced Practice Midwife

## 2024-05-13 VITALS — BP 113/72 | HR 102 | Wt 188.8 lb

## 2024-05-13 DIAGNOSIS — Z3A20 20 weeks gestation of pregnancy: Secondary | ICD-10-CM

## 2024-05-13 DIAGNOSIS — Z322 Encounter for childbirth instruction: Secondary | ICD-10-CM

## 2024-05-13 DIAGNOSIS — O26892 Other specified pregnancy related conditions, second trimester: Secondary | ICD-10-CM | POA: Diagnosis not present

## 2024-05-13 DIAGNOSIS — Z3490 Encounter for supervision of normal pregnancy, unspecified, unspecified trimester: Secondary | ICD-10-CM | POA: Diagnosis not present

## 2024-05-13 DIAGNOSIS — R102 Pelvic and perineal pain: Secondary | ICD-10-CM

## 2024-05-13 NOTE — Addendum Note (Signed)
 Addended by: Laneshia Pina L on: 05/13/2024 11:35 AM   Modules accepted: Orders

## 2024-05-13 NOTE — Progress Notes (Signed)
       PRENATAL VISIT NOTE- Centering Pregnancy Cycle 20, Session # 2  Subjective:  Chelsea Herman is a 18 y.o. [id number] at [redacted]w[redacted]d being seen today for ongoing prenatal care through Centering Pregnancy.  She is currently monitored for the following issues for this low-risk pregnancy and has Mild intermittent asthma without complication; Peanut allergy; History of gestational hypertension; History of gestational diabetes; HSV-2 (herpes simplex virus 2) infection; Supervision of high risk pregnancy, antepartum; Vaginal bleeding in pregnancy, first trimester; and Tachycardia on their problem list.  Patient reports pelvic pain/lower abdomen pain.  Contractions: Not present. Vag. Bleeding: None.  Movement: Present. Denies leaking of fluid/ROM.   The following portions of the patient's history were reviewed and updated as appropriate: allergies, current medications, past family history, past medical history, past social history, past surgical history and problem list. Problem list updated.  Objective:   Vitals:   05/13/24 0907  BP: 113/72  Pulse: (!) 102  Weight: 188 lb 12.8 oz (85.6 kg)    Fetal Status: Fetal Heart Rate (bpm): 153   Movement: Present     General:  Alert, oriented and cooperative. Patient is in no acute distress.  Skin: Skin is warm and dry. No rash noted.   Cardiovascular: Normal heart rate noted  Respiratory: Normal respiratory effort, no problems with respiration noted  Abdomen: Soft, gravid, appropriate for gestational age.  Pain/Pressure: Absent     Pelvic: Cervical exam deferred        Extremities: Normal range of motion.  Edema: None  Mental Status: Normal mood and affect. Normal behavior. Normal judgment and thought content.   Assessment and Plan:  Pregnancy: [id number] at [redacted]w[redacted]d  1. Encounter for supervision of low-risk pregnancy, antepartum  Centering Pregnancy, Session#2: Reviewed rules for self-governance with group. Direct group to [employer].    Facilitated discussion today:  Nutrition, oral health, body changes in pregnancy   Activity to review how to give and receive respect Activity-demonstrated exercises to alleviate back pain.  Fundal height and FHR appropriate today unless noted otherwise in plan. Patient to continue group care.   - AFP, Serum, Open Spina Bifida - T3, free - T4, free  2. Pelvic pain affecting pregnancy in second trimester, antepartum (Primary)  - Ambulatory referral to Physical Therapy  3. [redacted] weeks gestation of pregnancy     Preterm labor symptoms and general obstetric precautions including but not limited to vaginal bleeding, contractions, leaking of fluid and fetal movement were reviewed in detail with the patient. Please refer to After Visit Summary for other counseling recommendations.  Return for Centering Sessions as scheduled.  Future Appointments  Date Time Provider Department Center  05/17/2024  2:20 PM Jerryl Morin, DO [facility] None  05/23/2024  9:30 AM Helmus, Jitka, PT [facility] None  06/06/2024  3:00 PM [facility] PROVIDER 1 [facility]  06/06/2024  3:30 PM [facility] US2 [facility]  06/10/2024  9:00 AM CENTERING PROVIDER [facility]  06/24/2024  8:50 AM [facility] LAB [facility]  06/24/2024  9:00 AM CENTERING PROVIDER [facility]  07/08/2024  9:00 AM CENTERING PROVIDER [facility]  07/22/2024  9:00 AM CENTERING PROVIDER [facility]  08/05/2024  9:00 AM CENTERING PROVIDER [facility]  08/19/2024  9:00 AM CENTERING PROVIDER [facility]  09/02/2024  9:00 AM CENTERING PROVIDER [facility]  09/16/2024  9:00 AM CENTERING PROVIDER [facility]    Arlester Bence, CNM

## 2024-05-15 ENCOUNTER — Other Ambulatory Visit: Payer: Self-pay | Admitting: Advanced Practice Midwife

## 2024-05-15 ENCOUNTER — Encounter: Payer: Self-pay | Admitting: Advanced Practice Midwife

## 2024-05-15 DIAGNOSIS — R7989 Other specified abnormal findings of blood chemistry: Secondary | ICD-10-CM

## 2024-05-15 LAB — AFP, SERUM, OPEN SPINA BIFIDA
AFP MoM: 1.12
AFP Value: 65.6 ng/mL
Gest. Age on Collection Date: 20.6 wk
Maternal Age At EDD: 19.8 a
OSBR Risk 1 IN: 10000
Test Results:: NEGATIVE
Weight: 188 [lb_av]

## 2024-05-15 LAB — T4, FREE: Free T4: 0.83 ng/dL — ABNORMAL LOW (ref 0.93–1.60)

## 2024-05-15 LAB — T3, FREE: T3, Free: 2.8 pg/mL (ref 2.3–5.0)

## 2024-05-15 NOTE — Progress Notes (Unsigned)
 Low TSH and low free T4, will repeat thyroid panel and add additional thyroid evaluation after consult with family medicine.

## 2024-05-17 ENCOUNTER — Encounter: Payer: Self-pay | Admitting: *Deleted

## 2024-05-17 ENCOUNTER — Telehealth: Payer: Self-pay | Admitting: *Deleted

## 2024-05-17 ENCOUNTER — Ambulatory Visit: Admitting: Cardiology

## 2024-05-17 NOTE — Telephone Encounter (Signed)
-----   Message from Arlester Bence sent at 05/15/2024  9:53 PM EDT ----- Regarding: schedule lab Stana Ear,  Can you get our Centering scheduled for a lab only visit later this week or early next week?  She has abnormal thyroid labs that don't make sense.  I ordered a more complete panel after talking to our family practice fellow.  I sent Lunette a MyChart message.  Thank you!  Edwina Gram

## 2024-05-17 NOTE — Telephone Encounter (Signed)
 I called and hears voicemail not set up. I sent  a MyChart message to notify patient we need to schedule lab visit. Alejandra Hurst

## 2024-05-21 ENCOUNTER — Encounter (HOSPITAL_COMMUNITY): Payer: Self-pay | Admitting: Family Medicine

## 2024-05-21 ENCOUNTER — Inpatient Hospital Stay (HOSPITAL_COMMUNITY)
Admission: AD | Admit: 2024-05-21 | Discharge: 2024-05-21 | Disposition: A | Discharge status: Home / Self Care | Attending: Obstetrics and Gynecology | Admitting: Obstetrics and Gynecology

## 2024-05-21 ENCOUNTER — Other Ambulatory Visit: Payer: Self-pay

## 2024-05-21 DIAGNOSIS — Z3A22 22 weeks gestation of pregnancy: Secondary | ICD-10-CM | POA: Insufficient documentation

## 2024-05-21 DIAGNOSIS — O99512 Diseases of the respiratory system complicating pregnancy, second trimester: Secondary | ICD-10-CM | POA: Diagnosis not present

## 2024-05-21 DIAGNOSIS — Z3A21 21 weeks gestation of pregnancy: Secondary | ICD-10-CM | POA: Diagnosis not present

## 2024-05-21 DIAGNOSIS — O09292 Supervision of pregnancy with other poor reproductive or obstetric history, second trimester: Secondary | ICD-10-CM | POA: Diagnosis present

## 2024-05-21 DIAGNOSIS — O219 Vomiting of pregnancy, unspecified: Secondary | ICD-10-CM | POA: Insufficient documentation

## 2024-05-21 DIAGNOSIS — O24112 Pre-existing diabetes mellitus, type 2, in pregnancy, second trimester: Secondary | ICD-10-CM | POA: Insufficient documentation

## 2024-05-21 DIAGNOSIS — O99891 Other specified diseases and conditions complicating pregnancy: Secondary | ICD-10-CM | POA: Insufficient documentation

## 2024-05-21 DIAGNOSIS — O26892 Other specified pregnancy related conditions, second trimester: Secondary | ICD-10-CM | POA: Diagnosis not present

## 2024-05-21 DIAGNOSIS — O212 Late vomiting of pregnancy: Secondary | ICD-10-CM | POA: Insufficient documentation

## 2024-05-21 DIAGNOSIS — R109 Unspecified abdominal pain: Secondary | ICD-10-CM

## 2024-05-21 DIAGNOSIS — O10912 Unspecified pre-existing hypertension complicating pregnancy, second trimester: Secondary | ICD-10-CM | POA: Insufficient documentation

## 2024-05-21 LAB — CBC
HCT: 34.2 % — ABNORMAL LOW (ref 36.0–46.0)
Hemoglobin: 11.4 g/dL — ABNORMAL LOW (ref 12.0–15.0)
MCH: 27.7 pg (ref 26.0–34.0)
MCHC: 33.3 g/dL (ref 30.0–36.0)
MCV: 83 fL (ref 80.0–100.0)
Platelets: 260 10*3/uL (ref 150–400)
RBC: 4.12 MIL/uL (ref 3.87–5.11)
RDW: 13.3 % (ref 11.5–15.5)
WBC: 6.6 10*3/uL (ref 4.0–10.5)
nRBC: 0 % (ref 0.0–0.2)

## 2024-05-21 LAB — URINALYSIS, ROUTINE W REFLEX MICROSCOPIC
Bilirubin Urine: NEGATIVE
Glucose, UA: NEGATIVE mg/dL
Hgb urine dipstick: NEGATIVE
Ketones, ur: NEGATIVE mg/dL
Leukocytes,Ua: NEGATIVE
Nitrite: NEGATIVE
Protein, ur: 30 mg/dL — AB
Specific Gravity, Urine: 1.026 (ref 1.005–1.030)
pH: 6 (ref 5.0–8.0)

## 2024-05-21 LAB — COMPREHENSIVE METABOLIC PANEL WITH GFR
ALT: 10 U/L (ref 0–44)
AST: 15 U/L (ref 15–41)
Albumin: 3.2 g/dL — ABNORMAL LOW (ref 3.5–5.0)
Alkaline Phosphatase: 60 U/L (ref 38–126)
Anion gap: 7 (ref 5–15)
BUN: <5 mg/dL — ABNORMAL LOW (ref 6–20)
CO2: 21 mmol/L — ABNORMAL LOW (ref 22–32)
Calcium: 8.6 mg/dL — ABNORMAL LOW (ref 8.9–10.3)
Chloride: 108 mmol/L (ref 98–111)
Creatinine, Ser: 0.65 mg/dL (ref 0.44–1.00)
GFR, Estimated: >60 mL/min (ref >60)
Glucose, Bld: 77 mg/dL (ref 70–99)
Potassium: 3.6 mmol/L (ref 3.5–5.1)
Sodium: 136 mmol/L (ref 135–145)
Total Bilirubin: 0.5 mg/dL (ref 0.0–1.2)
Total Protein: 6.3 g/dL — ABNORMAL LOW (ref 6.5–8.1)

## 2024-05-21 LAB — WET PREP, GENITAL
Clue Cells Wet Prep HPF POC: NONE SEEN
Sperm: NONE SEEN
Trich, Wet Prep: NONE SEEN
WBC, Wet Prep HPF POC: >=10 — AB (ref <10)
Yeast Wet Prep HPF POC: NONE SEEN

## 2024-05-21 MED ORDER — CYCLOBENZAPRINE HCL 5 MG PO TABS
10.0000 mg | ORAL_TABLET | Freq: Once | ORAL | Status: AC
  Administered 2024-05-21: 10 mg via ORAL
  Filled 2024-05-21: qty 2

## 2024-05-21 MED ORDER — FAMOTIDINE 20 MG PO TABS: 20.0000 mg | ORAL_TABLET | Freq: Two times a day (BID) | ORAL | 0 refills | Status: DC

## 2024-05-21 MED ORDER — ONDANSETRON HCL 4 MG/2ML IJ SOLN
4.0000 mg | Freq: Once | INTRAMUSCULAR | Status: AC
  Administered 2024-05-21: 4 mg via INTRAVENOUS
  Filled 2024-05-21: qty 2

## 2024-05-21 MED ORDER — ACETAMINOPHEN 500 MG PO TABS
1000.0000 mg | ORAL_TABLET | Freq: Once | ORAL | Status: AC
  Administered 2024-05-21: 1000 mg via ORAL
  Filled 2024-05-21: qty 2

## 2024-05-21 MED ORDER — ONDANSETRON HCL 4 MG PO TABS: 8.0000 mg | ORAL_TABLET | Freq: Two times a day (BID) | ORAL | 0 refills | Status: DC

## 2024-05-21 MED ORDER — FAMOTIDINE IN NACL 20-0.9 MG/50ML-% IV SOLN
20.0000 mg | Freq: Once | INTRAVENOUS | Status: AC
  Administered 2024-05-21: 20 mg via INTRAVENOUS
  Filled 2024-05-21: qty 50

## 2024-05-21 MED ORDER — LACTATED RINGERS IV BOLUS
1000.0000 mL | Freq: Once | INTRAVENOUS | Status: AC
  Administered 2024-05-21: 1000 mL via INTRAVENOUS

## 2024-05-21 NOTE — MAU Note (Signed)
 Chelsea Herman is a 18 y.o. at [redacted]w[redacted]d here in MAU reporting: contractions every 2-3 minutes for 30 minutes, now spacing out. Also noted spotting on wiping about two hours ago. Reports +FM. Denies LOF.    Onset of complaint: 2 hours ago Pain score: 7/10 Vitals:   05/21/24 1602  BP: 114/65  Pulse: 70  Resp: 18  Temp: 98 F (36.7 C)  SpO2: 100%     FHT: 155  Lab orders placed from triage: none

## 2024-05-21 NOTE — MAU Provider Note (Signed)
 Chief Complaint:  Contractions   HPI      Chelsea Herman is a 18 y.o. [id number] at [redacted]w[redacted]d who presents to maternity admissions via EMS reporting she has had ctx q 2-3 minutes for the past 30 minutes with abdominal pain. She reports N/V with vomiting all day today and diarrhea last night. Denies fever or chills and denies LOF and reports good FM's. She reported after using the BR ~ 2 hours ago she had some spotting after wiping.  Pregnancy Course: [facility]   Past Medical History:  Diagnosis Date   Asthma    as a child-no current medications or treatments   Diabetes mellitus without complication (HCC)    Gestational diabetes    History of gestational hypertension 02/04/2021   Hypertension    Migraines    OB History  Gravida Para Term Preterm AB Living  4 2 2  0 1 2  SAB IAB Ectopic Multiple Live Births  1 0 0 0 2    # Outcome Date GA Lbr Len/2nd Weight Sex Type Anes PTL Lv  4 Current           3 SAB 11/13/23 [redacted]w[redacted]d         2 Term 01/02/23 [redacted]w[redacted]d 01:00 3600 g M Vag-Spont None  LIV     Birth Comments: GDM  1 Term 02/04/21 [redacted]w[redacted]d 03:34 / 00:31 3464 g F Vag-Spont EPI  LIV     Birth Comments: GHTN   Past Surgical History:  Procedure Laterality Date   NO PAST SURGERIES     Family History  Problem Relation Age of Onset   Hearing loss Mother    Asthma Neg Hx    Cancer Neg Hx    Diabetes Neg Hx    Stroke Neg Hx    Social History   Tobacco Use   Smoking status: Never    Passive exposure: Never   Smokeless tobacco: Never  Vaping Use   Vaping status: Never Used  Substance Use Topics   Alcohol use: Never   Drug use: Never   Allergies  Allergen Reactions   Peanut-Containing Drug Products Anaphylaxis   Citric Acid Rash   Medications Prior to Admission  Medication Sig Dispense Refill Last Dose/Taking   Prenatal Vit-Fe Fumarate-FA (PREPLUS) 27-1 MG TABS Take 1 tablet by mouth daily. 30 tablet 13 05/20/2024   aspirin  EC 81 MG tablet Take 1 tablet (81 mg total) by mouth daily.  Swallow whole. 30 tablet 12    Blood Pressure Monitoring (BLOOD PRESSURE KIT) DEVI 1 Device by Does not apply route as needed. 1 each 0    Iron Combinations (IRON COMPLEX PO) Take 1 tablet by mouth daily at 6 (six) AM.      Omega-3 Fatty Acids (OMEGA-3 FISH OIL PO) Take 2 capsules by mouth daily at 6 (six) AM.      Vitamin D-Vitamin K (VITAMIN D2 + K1 PO) Take 1 tablet by mouth daily at 6 (six) AM. Vitamin K2, D3 actual name       I have reviewed patient's Past Medical Hx, Surgical Hx, Family Hx, Social Hx, medications and allergies.   ROS  Pertinent items noted in HPI and remainder of comprehensive ROS otherwise negative.   PHYSICAL EXAM   Patient Vitals for the past 24 hrs:  BP Temp Temp src Pulse Resp SpO2  05/21/24 1633 116/67 -- -- 69 -- --  05/21/24 1602 114/65 98 F (36.7 C) Oral 70 18 100 %    Constitutional: Well-developed, obese  female in no acute distress.  Cardiovascular: normal rate & rhythm, warm and well-perfused Respiratory: normal effort, no problems with respiration noted GI: Abd soft, gravid, difficulty palpating her abdomen patient jumps with light palpation (  Will reassess after IVF and medications)  Abdominal exam repeated @ 1817 after medications and IVF (Exam now benign, no guarding, no rebound, no rigidity and Soft) MS: Extremities nontender, no edema, normal ROM Neurologic: Alert and oriented x 4.  GU: no CVA tenderness Pelvic: exam chaperoned by Ardella Beaver RN SSE: No pooling, scant discharge, cervix appears visually closed Cervical Exam: Long /Closed     Fetal Tracing: 155 vis doppler  Toco:    Labs: Results for orders placed or performed during the hospital encounter of 05/21/24 (from the past 24 hours)  Urinalysis, Routine w reflex microscopic -Urine, Random     Status: Abnormal   Collection Time: 05/21/24  4:29 PM  Result Value Ref Range   Color, Urine YELLOW YELLOW   APPearance HAZY (A) CLEAR   Specific Gravity, Urine 1.026 1.005 -  1.030   pH 6.0 5.0 - 8.0   Glucose, UA NEGATIVE NEGATIVE mg/dL   Hgb urine dipstick NEGATIVE NEGATIVE   Bilirubin Urine NEGATIVE NEGATIVE   Ketones, ur NEGATIVE NEGATIVE mg/dL   Protein, ur 30 (A) NEGATIVE mg/dL   Nitrite NEGATIVE NEGATIVE   Leukocytes,Ua NEGATIVE NEGATIVE   RBC / HPF 0-5 0 - 5 RBC/hpf   WBC, UA 0-5 0 - 5 WBC/hpf   Bacteria, UA RARE (A) NONE SEEN   Squamous Epithelial / HPF 0-5 0 - 5 /HPF   Mucus PRESENT   Wet prep, genital     Status: Abnormal   Collection Time: 05/21/24  4:29 PM   Specimen: Urine, Random  Result Value Ref Range   Yeast Wet Prep HPF POC NONE SEEN NONE SEEN   Trich, Wet Prep NONE SEEN NONE SEEN   Clue Cells Wet Prep HPF POC NONE SEEN NONE SEEN   WBC, Wet Prep HPF POC >=10 (A) <10   Sperm NONE SEEN   CBC     Status: Abnormal   Collection Time: 05/21/24  4:59 PM  Result Value Ref Range   WBC 6.6 4.0 - 10.5 K/uL   RBC 4.12 3.87 - 5.11 MIL/uL   Hemoglobin 11.4 (L) 12.0 - 15.0 g/dL   HCT 16.1 (L) 09.6 - 04.5 %   MCV 83.0 80.0 - 100.0 fL   MCH 27.7 26.0 - 34.0 pg   MCHC 33.3 30.0 - 36.0 g/dL   RDW 40.9 81.1 - 91.4 %   Platelets 260 150 - 400 K/uL   nRBC 0.0 0.0 - 0.2 %  Comprehensive metabolic panel     Status: Abnormal   Collection Time: 05/21/24  4:59 PM  Result Value Ref Range   Sodium 136 135 - 145 mmol/L   Potassium 3.6 3.5 - 5.1 mmol/L   Chloride 108 98 - 111 mmol/L   CO2 21 (L) 22 - 32 mmol/L   Glucose, Bld 77 70 - 99 mg/dL   BUN <5 (L) 6 - 20 mg/dL   Creatinine, Ser 7.82 0.44 - 1.00 mg/dL   Calcium 8.6 (L) 8.9 - 10.3 mg/dL   Total Protein 6.3 (L) 6.5 - 8.1 g/dL   Albumin 3.2 (L) 3.5 - 5.0 g/dL   AST 15 15 - 41 U/L   ALT 10 0 - 44 U/L   Alkaline Phosphatase 60 38 - 126 U/L   Total Bilirubin 0.5 0.0 -  1.2 mg/dL   GFR, Estimated >16 >10 mL/min   Anion gap 7 5 - 15    Imaging:  No results found.  MDM & MAU COURSE  MDM:  High  Abdominal pain/Pelvic pain ( R/O Acute pathology) Labs: r/o infectious process Swabs  obtained U/A obtained IVF and antiemetics administered for N/V  Tylenol /Flexeril  for pain No Ctx on Tocometer  Serial abdominal exams performed    MAU Course: Orders Placed This Encounter  Procedures   Wet prep, genital   Urinalysis, Routine w reflex microscopic -Urine, Random   CBC   Comprehensive metabolic panel   Discharge patient Discharge disposition: 01-Home or Self Care; Discharge patient date: 05/21/2024   Meds ordered this encounter  Medications   lactated ringers  bolus 1,000 mL   famotidine  (PEPCID ) IVPB 20 mg premix   ondansetron  (ZOFRAN ) injection 4 mg   acetaminophen  (TYLENOL ) tablet 1,000 mg   cyclobenzaprine  (FLEXERIL ) tablet 10 mg   ondansetron  (ZOFRAN ) 4 MG tablet    Sig: Take 2 tablets (8 mg total) by mouth 2 (two) times daily.    Dispense:  20 tablet    Refill:  0    Supervising Provider:   PRATT, TANYA S [[id number]]   famotidine  (PEPCID ) 20 MG tablet    Sig: Take 1 tablet (20 mg total) by mouth 2 (two) times daily.    Dispense:  60 tablet    Refill:  0    Supervising Provider:   PRATT, TANYA S [[id number]]    I have reviewed the patient chart and performed the physical exam . I have ordered & interpreted the lab results and reviewed them with the patient  Medications ordered as stated below.  A/P as described below.  Counseling and education provided and patient agreeable  with plan as described below. Verbalized understanding.    ASSESSMENT   1. Abdominal pain during pregnancy, second trimester   2. [redacted] weeks gestation of pregnancy   3. Nausea and vomiting during pregnancy prior to [redacted] weeks gestation     PLAN  Discharge home in stable condition with return precautions.   See AVS for full description of information given to the patient including both verbal and written. Patient verbalized understanding and agrees with the plan as described above.     Follow-up Information     [facility] for [facility] Follow up.    Specialty: Obstetrics and Gynecology Why: If symptoms worsen or fail to resolve, As scheduled for ongoing prenatal care Contact information: [address] [place]   [address] [phone]                Allergies as of 05/21/2024       Reactions   Peanut-containing Drug Products Anaphylaxis   Citric Acid Rash        Medication List     TAKE these medications    aspirin  EC 81 MG tablet Take 1 tablet (81 mg total) by mouth daily. Swallow whole.   Blood Pressure Kit Devi 1 Device by Does not apply route as needed.   famotidine  20 MG tablet Commonly known as: Pepcid  Take 1 tablet (20 mg total) by mouth 2 (two) times daily.   IRON COMPLEX PO Take 1 tablet by mouth daily at 6 (six) AM.   OMEGA-3 FISH OIL PO Take 2 capsules by mouth daily at 6 (six) AM.   ondansetron  4 MG tablet Commonly known as: Zofran  Take 2 tablets (8 mg total) by mouth 2 (  two) times daily.   PrePLUS 27-1 MG Tabs Take 1 tablet by mouth daily.   VITAMIN D2 + K1 PO Take 1 tablet by mouth daily at 6 (six) AM. Vitamin K2, D3 actual name        Debbe Fail, MSN, [facility], Center for [facility]

## 2024-05-22 ENCOUNTER — Telehealth: Payer: Self-pay | Admitting: Advanced Practice Midwife

## 2024-05-22 LAB — GC/CHLAMYDIA PROBE AMP (~~LOC~~) NOT AT ARMC
Chlamydia: NEGATIVE
Comment: NEGATIVE
Comment: NORMAL
Neisseria Gonorrhea: NEGATIVE

## 2024-05-22 NOTE — Telephone Encounter (Signed)
 Attempt to call patient regarding thyroid labs and need for follow up/repeat labs but unable to leave message on pt phone.  MyChart message sent.

## 2024-05-23 ENCOUNTER — Telehealth: Payer: Self-pay | Admitting: Physical Therapy

## 2024-05-23 ENCOUNTER — Ambulatory Visit: Attending: Advanced Practice Midwife | Admitting: Physical Therapy

## 2024-05-23 NOTE — Therapy (Incomplete)
 OUTPATIENT PHYSICAL THERAPY FEMALE PELVIC EVALUATION   Patient Name: Chelsea Herman MRN: [id number] DOB:29-Jan-2006, 18 y.o., female Today's Date: 05/23/2024  END OF SESSION:   Past Medical History:  Diagnosis Date   Asthma    as a child-no current medications or treatments   Diabetes mellitus without complication (HCC)    Gestational diabetes    History of gestational hypertension 02/04/2021   Hypertension    Migraines    Past Surgical History:  Procedure Laterality Date   NO PAST SURGERIES     Patient Active Problem List   Diagnosis Date Noted   Tachycardia 03/29/2024   Supervision of high risk pregnancy, antepartum 02/21/2024   Vaginal bleeding in pregnancy, first trimester 02/21/2024   HSV-2 (herpes simplex virus 2) infection 01/25/2024   History of gestational diabetes 11/01/2022   History of gestational hypertension 02/04/2021   Mild intermittent asthma without complication 08/07/2017   Peanut allergy 08/07/2017    PCP: [facility]  REFERRING PROVIDER: Asher Lawn, CNM  REFERRING DIAG: [phone] (ICD-10-CM) - Pelvic pain affecting pregnancy in second trimester, antepartum  THERAPY DIAG:  No diagnosis found.  Rationale for Evaluation and Treatment: {HABREHAB:27488}  ONSET DATE: ***  SUBJECTIVE:                                                                                                                                                                                           SUBJECTIVE STATEMENT: *** Fluid intake:   PAIN:  Are you having pain? {yes/no:20286} NPRS scale: ***/10 Pain location: {pelvic pain location:27098}  Pain type: {type:313116} Pain description: {PAIN DESCRIPTION:21022940}   Aggravating factors: *** Relieving factors: ***  PRECAUTIONS: {Therapy precautions:24002}  RED FLAGS: {PT Red Flags:29287}   WEIGHT BEARING RESTRICTIONS: {Yes ***/No:24003}  FALLS:  Has patient fallen in last 6  months? {fallsyesno:27318}  OCCUPATION: ***  ACTIVITY LEVEL : ***  PLOF: {PLOF:24004}  PATIENT GOALS: ***  PERTINENT HISTORY:  *** Sexual abuse: {Yes/No:304960894}  BOWEL MOVEMENT: Pain with bowel movement: {yes/no:20286} Type of bowel movement:{PT BM type:27100} Fully empty rectum: {No/Yes:304960894} Leakage: {Yes/No:304960894} Pads: {Yes/No:304960894} Fiber supplement/laxative {YES/NO AS:20300}  URINATION: Pain with urination: {yes/no:20286} Fully empty bladder: {Yes/No:304960894}*** Stream: {PT urination:27102} Urgency: {YES/NO AS:20300} Frequency: *** Leakage: {PT leakage:27103} Pads: {Yes/No:304960894}  INTERCOURSE:  Ability to have vaginal penetration {YES/NO:21197} Pain with intercourse: {pain with intercourse PA:27099} Dryness{YES/NO AS:20300} Climax: *** Marinoff Scale: ***/3 Laxative:  PREGNANCY: Vaginal deliveries *** Tearing {Yes***/No:304960894} Episiotomy {YES/NO AS:20300} C-section deliveries *** Currently pregnant {Yes***/No:304960894}  PROLAPSE: {PT prolapse:27101}   OBJECTIVE:  Note: Objective measures were completed at Evaluation unless otherwise noted.  DIAGNOSTIC FINDINGS:  ***  PATIENT SURVEYS:  {  rehab surveys:24030}  PFIQ-7: ***  COGNITION: Overall cognitive status: {cognition:24006}     SENSATION: Light touch: {intact/deficits:24005}  LUMBAR SPECIAL TESTS:  {lumbar special test:25242}  FUNCTIONAL TESTS:  {Functional tests:24029}  GAIT: Assistive device utilized: {Assistive devices:23999} Comments: ***  POSTURE: {posture:25561}   LUMBARAROM/PROM:  A/PROM A/PROM  eval  Flexion   Extension   Right lateral flexion   Left lateral flexion   Right rotation   Left rotation    (Blank rows = not tested)  LOWER EXTREMITY ROM:  {AROM/PROM:27142} ROM Right eval Left eval  Hip flexion    Hip extension    Hip abduction    Hip adduction    Hip internal rotation    Hip external rotation    Knee flexion     Knee extension    Ankle dorsiflexion    Ankle plantarflexion    Ankle inversion    Ankle eversion     (Blank rows = not tested)  LOWER EXTREMITY MMT:  MMT Right eval Left eval  Hip flexion    Hip extension    Hip abduction    Hip adduction    Hip internal rotation    Hip external rotation    Knee flexion    Knee extension    Ankle dorsiflexion    Ankle plantarflexion    Ankle inversion    Ankle eversion     (Blank rows = not tested) PALPATION:   General: ***  Pelvic Alignment: ***  Abdominal: ***                External Perineal Exam: ***                             Internal Pelvic Floor: ***  Patient confirms identification and approves PT to assess internal pelvic floor and treatment {yes/no:20286}  PELVIC MMT:   MMT eval  Vaginal   Internal Anal Sphincter   External Anal Sphincter   Puborectalis   Diastasis Recti   (Blank rows = not tested)        TONE: ***  PROLAPSE: ***  TODAY'S TREATMENT:                                                                                                                              DATE: ***  EVAL ***   PATIENT EDUCATION:  Education details: *** Person educated: {Person educated:25204} Education method: {Education Method:25205} Education comprehension: {Education Comprehension:25206}  HOME EXERCISE PROGRAM: ***  ASSESSMENT:  CLINICAL IMPRESSION: Patient is a *** y.o. *** who was seen today for physical therapy evaluation and treatment for ***.   OBJECTIVE IMPAIRMENTS: {opptimpairments:25111}.   ACTIVITY LIMITATIONS: {activitylimitations:27494}  PARTICIPATION LIMITATIONS: {participationrestrictions:25113}  PERSONAL FACTORS: {Personal factors:25162} are also affecting patient's functional outcome.   REHAB POTENTIAL: {rehabpotential:25112}  CLINICAL DECISION MAKING: {clinical decision making:25114}  EVALUATION COMPLEXITY: {Evaluation complexity:25115}   GOALS: Goals reviewed with patient?  {yes/no:20286}  SHORT TERM GOALS: Target date: ***  *** Baseline: Goal status: INITIAL  2.  *** Baseline:  Goal status: INITIAL  3.  *** Baseline:  Goal status: INITIAL  4.  *** Baseline:  Goal status: INITIAL  5.  *** Baseline:  Goal status: INITIAL  6.  *** Baseline:  Goal status: INITIAL  LONG TERM GOALS: Target date: ***  *** Baseline:  Goal status: INITIAL  2.  *** Baseline:  Goal status: INITIAL  3.  *** Baseline:  Goal status: INITIAL  4.  *** Baseline:  Goal status: INITIAL  5.  *** Baseline:  Goal status: INITIAL  6.  *** Baseline:  Goal status: INITIAL  PLAN:  PT FREQUENCY: {rehab frequency:25116}  PT DURATION: {rehab duration:25117}  PLANNED INTERVENTIONS: {rehab planned interventions:25118::"97110-Therapeutic exercises","97530- Therapeutic [phone]- Neuromuscular re-education","97535- Self JXBJ","47829- Manual therapy"}  PLAN FOR NEXT SESSION: ***   Shawndale Kilpatrick, PT 05/23/2024, 7:48 AM

## 2024-05-23 NOTE — Telephone Encounter (Signed)
 Called pt re no show for her PT eval this morning, unable to leave a voicemail, tried [phone].

## 2024-05-27 ENCOUNTER — Telehealth: Payer: Self-pay | Admitting: *Deleted

## 2024-05-27 ENCOUNTER — Encounter: Payer: Self-pay | Admitting: *Deleted

## 2024-05-27 NOTE — Telephone Encounter (Signed)
 Requested by provider to call patient for thyroid labwork. I called patient and heard message patient can not be reached. I called a second time and phone rang many times without an answer.  I sent a MyChart message to patient. Alejandra Hurst

## 2024-06-06 ENCOUNTER — Ambulatory Visit (HOSPITAL_BASED_OUTPATIENT_CLINIC_OR_DEPARTMENT_OTHER): Admitting: Maternal & Fetal Medicine

## 2024-06-06 ENCOUNTER — Ambulatory Visit: Attending: Obstetrics

## 2024-06-06 DIAGNOSIS — E669 Obesity, unspecified: Secondary | ICD-10-CM | POA: Diagnosis not present

## 2024-06-06 DIAGNOSIS — O43123 Velamentous insertion of umbilical cord, third trimester: Secondary | ICD-10-CM | POA: Diagnosis present

## 2024-06-06 DIAGNOSIS — O99212 Obesity complicating pregnancy, second trimester: Secondary | ICD-10-CM

## 2024-06-06 DIAGNOSIS — Z3A24 24 weeks gestation of pregnancy: Secondary | ICD-10-CM

## 2024-06-06 DIAGNOSIS — O0990 Supervision of high risk pregnancy, unspecified, unspecified trimester: Secondary | ICD-10-CM

## 2024-06-06 DIAGNOSIS — O09292 Supervision of pregnancy with other poor reproductive or obstetric history, second trimester: Secondary | ICD-10-CM | POA: Diagnosis not present

## 2024-06-06 DIAGNOSIS — Z362 Encounter for other antenatal screening follow-up: Secondary | ICD-10-CM | POA: Insufficient documentation

## 2024-06-06 NOTE — Progress Notes (Signed)
   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 [id number] at [redacted]w[redacted]d here for ultrasound and consultation. Amarilys Alves is doing well today with no acute concerns. Today we focused on the following:   Velamentous cord insertion: Due to this I recommend serial growth US  and antenatal testing at 36 weeks.    Sonographic findings Single intrauterine pregnancy at 24w 0d.  Fetal cardiac activity:  Observed and appears normal. Presentation: Breech. Interval fetal anatomy appears normal. Fetal biometry shows the estimated fetal weight at the 26 percentile. Amniotic fluid volume: Within normal limits. MVP: 7.15 cm. Placenta: Anterior.  There are limitations of prenatal ultrasound such as the inability to detect certain abnormalities due to poor visualization. Various factors such as fetal position, gestational age and maternal body habitus may increase the difficulty in visualizing the fetal anatomy.     Recommendations -Serial growth ultrasounds every 4 weeks until delivery -Antenatal testing to start around 36 weeks    Review of Systems: A review of systems was performed and was negative except per HPI   Vitals and Physical Exam    06/06/2024    3:02 PM 05/21/2024    6:27 PM 05/21/2024    4:33  PM  Vitals with BMI  Systolic 118 117 147  Diastolic 67 59 67  Pulse 78 53 69    Sitting comfortably on the sonogram table Nonlabored breathing Normal rate and rhythm Abdomen is nontender  Past pregnancies OB History  Gravida Para Term Preterm AB Living  4 2 2  0 1 2  SAB IAB Ectopic Multiple Live Births  1 0 0 0 2    # Outcome Date GA Lbr Len/2nd Weight Sex Type Anes PTL Lv  4 Current           3 SAB 11/13/23 [redacted]w[redacted]d         2 Term 01/02/23 [redacted]w[redacted]d 01:00 7 lb 15 oz (3.6 kg) M Vag-Spont None  LIV     Birth Comments: GDM  1 Term 02/04/21 [redacted]w[redacted]d 03:34 / 00:31 7 lb 10.2 oz (3.464 kg) F Vag-Spont EPI  LIV     Birth Comments: GHTN     I spent 20 minutes reviewing the patients chart, including labs and images as well as counseling the patient about her medical conditions. Greater than 50% of the time was spent in direct face-to-face patient counseling.  Penney Bowling  MFM, [facility]   06/06/2024  4:06 PM

## 2024-06-07 ENCOUNTER — Other Ambulatory Visit: Payer: Self-pay | Admitting: *Deleted

## 2024-06-07 DIAGNOSIS — O43123 Velamentous insertion of umbilical cord, third trimester: Secondary | ICD-10-CM

## 2024-06-07 DIAGNOSIS — J45909 Unspecified asthma, uncomplicated: Secondary | ICD-10-CM

## 2024-06-07 DIAGNOSIS — Z8759 Personal history of other complications of pregnancy, childbirth and the puerperium: Secondary | ICD-10-CM

## 2024-06-07 DIAGNOSIS — Z8632 Personal history of gestational diabetes: Secondary | ICD-10-CM

## 2024-06-10 ENCOUNTER — Ambulatory Visit: Payer: Self-pay | Admitting: Advanced Practice Midwife

## 2024-06-10 VITALS — BP 116/75 | HR 80 | Wt 191.2 lb

## 2024-06-10 DIAGNOSIS — O43123 Velamentous insertion of umbilical cord, third trimester: Secondary | ICD-10-CM

## 2024-06-10 DIAGNOSIS — Z8632 Personal history of gestational diabetes: Secondary | ICD-10-CM | POA: Diagnosis not present

## 2024-06-10 DIAGNOSIS — O26892 Other specified pregnancy related conditions, second trimester: Secondary | ICD-10-CM

## 2024-06-10 DIAGNOSIS — R109 Unspecified abdominal pain: Secondary | ICD-10-CM | POA: Diagnosis not present

## 2024-06-10 DIAGNOSIS — R7989 Other specified abnormal findings of blood chemistry: Secondary | ICD-10-CM

## 2024-06-10 DIAGNOSIS — Z3A24 24 weeks gestation of pregnancy: Secondary | ICD-10-CM

## 2024-06-10 DIAGNOSIS — O26899 Other specified pregnancy related conditions, unspecified trimester: Secondary | ICD-10-CM

## 2024-06-10 DIAGNOSIS — Z3490 Encounter for supervision of normal pregnancy, unspecified, unspecified trimester: Secondary | ICD-10-CM

## 2024-06-10 DIAGNOSIS — O0990 Supervision of high risk pregnancy, unspecified, unspecified trimester: Secondary | ICD-10-CM

## 2024-06-10 NOTE — Progress Notes (Addendum)
 PRENATAL VISIT NOTE- Centering Pregnancy Cycle 20, Session # 3  Subjective:  Chelsea Herman is a 18 y.o. [id number] at [redacted]w[redacted]d being seen today for ongoing prenatal care through Centering Pregnancy.  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  Patient reports occasional contractions.  Contractions: Irregular. Vag. Bleeding: None.  Movement: Present. Denies leaking of fluid/ROM.   The following portions of the patient's history were reviewed and updated as appropriate: allergies, current medications, past family history, past medical history, past social history, past surgical history and problem list. Problem list updated.  Objective:   Vitals:   06/10/24 1123  BP: 116/75  Pulse: 80  Weight: 191 lb 3.2 oz (86.7 kg)    Fetal Status: Fetal Heart Rate (bpm): 143 Fundal Height: 24 cm Movement: Present     General:  Alert, oriented and cooperative. Patient is in no acute distress.  Skin: Skin is warm and dry. No rash noted.   Cardiovascular: Normal heart rate noted  Respiratory: Normal respiratory effort, no problems with respiration noted  Abdomen: Soft, gravid, appropriate for gestational age.  Pain/Pressure: Present     Pelvic: Cervical exam deferred        Extremities: Normal range of motion.  Edema: None  Mental Status: Normal mood and affect. Normal behavior. Normal judgment and thought content.   Assessment and Plan:  Pregnancy: [id number] at [redacted]w[redacted]d  1. Abnormal TSH (Primary) --TSH slightly low, then T4 also slightly low.  Repeat thyroid labs with GTT.  - TSH; Future -  T3, free; Future - T4, free; Future  2. Encounter for supervision of low-risk pregnancy, antepartum --Anticipatory guidance about next visits/weeks of pregnancy given.   Centering Pregnancy, Session#3: Reviewed resources in [employer].   Facilitated discussion today:  Stress/Stress reduction, discomforts of pregnancy/solutions, breastfeeding/infant nutrition, mindfulness activity completed as well as deep breathing/relaxation/centering  Fundal height and FHR appropriate today unless noted otherwise in plan. Patient to continue group care.     3. History of gestational diabetes --GTT at next visit  4. 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 Reviewed previous US , questions answered. Growth wnl with EFW 26%, F/U growth US  ordered.   5. [redacted] weeks gestation of pregnancy   6. Abdominal pain affecting pregnancy --Pt with MAU visit with contractions, cervix closed.  Reports frequent braxton-hicks irregular contractions.    --Rest/ice/heat/warm bath/increase PO fluids/Tylenol /pregnancy support belt (support belt given in Centering today)  Preterm labor symptoms and general obstetric precautions including but not limited to vaginal bleeding, contractions, leaking of fluid and fetal movement were reviewed in detail with the patient. Please refer to After Visit Summary for other counseling recommendations.  Return for Centering Sessions as scheduled.  Future Appointments  Date Time Provider Department Center  06/24/2024  8:50 AM [facility] LAB [facility]  06/24/2024  9:00 AM CENTERING PROVIDER [facility]  07/08/2024  9:00 AM CENTERING PROVIDER [facility]  07/09/2024  1:00 PM WMC-MFC PROVIDER 1 [facility]  07/09/2024  1:30 PM WMC-MFC US4 [facility]  07/22/2024  9:00 AM CENTERING PROVIDER [facility]  08/01/2024  1:00 PM WMC-M[facility] PROVIDER 1 [facility]  08/01/2024  1:30 PM  WMC-MFC US1 [facility]  08/05/2024  9:00 AM CENTERING PROVIDER  [facility]  08/19/2024  9:00 AM CENTERING PROVIDER [facility]  08/29/2024  1:00 PM WMC-MFC PROVIDER 1 WMC[facility]  08/29/2024  1:30 PM WMC-MFC US1 [facility]  09/02/2024  9:00 AM CENTERING PROVIDER [facility]  09/05/2024  2:15 PM WMC-MFC NST [facility]  09/12/2024  1:15 PM WMC-MFC NST [facility]  09/16/2024  9:00 AM CENTERING PROVIDER [facility]  09/19/2024  1:15 PM WMC-MFC NST WMC-MFC [facility]    Arlester Bence, CNM

## 2024-06-17 ENCOUNTER — Other Ambulatory Visit: Payer: Self-pay

## 2024-06-24 ENCOUNTER — Ambulatory Visit: Payer: Self-pay | Admitting: Advanced Practice Midwife

## 2024-06-24 ENCOUNTER — Encounter: Payer: Self-pay | Admitting: Advanced Practice Midwife

## 2024-06-24 ENCOUNTER — Other Ambulatory Visit

## 2024-06-24 ENCOUNTER — Other Ambulatory Visit: Payer: Self-pay

## 2024-06-24 VITALS — BP 108/72 | HR 73 | Wt 192.6 lb

## 2024-06-24 DIAGNOSIS — O368120 Decreased fetal movements, second trimester, not applicable or unspecified: Secondary | ICD-10-CM

## 2024-06-24 DIAGNOSIS — O09292 Supervision of pregnancy with other poor reproductive or obstetric history, second trimester: Secondary | ICD-10-CM

## 2024-06-24 DIAGNOSIS — O0990 Supervision of high risk pregnancy, unspecified, unspecified trimester: Secondary | ICD-10-CM

## 2024-06-24 DIAGNOSIS — Z3A26 26 weeks gestation of pregnancy: Secondary | ICD-10-CM

## 2024-06-24 DIAGNOSIS — Z3490 Encounter for supervision of normal pregnancy, unspecified, unspecified trimester: Secondary | ICD-10-CM

## 2024-06-24 DIAGNOSIS — O43123 Velamentous insertion of umbilical cord, third trimester: Secondary | ICD-10-CM

## 2024-06-24 DIAGNOSIS — Z8632 Personal history of gestational diabetes: Secondary | ICD-10-CM

## 2024-06-24 DIAGNOSIS — O0992 Supervision of high risk pregnancy, unspecified, second trimester: Secondary | ICD-10-CM | POA: Diagnosis not present

## 2024-06-24 DIAGNOSIS — R7989 Other specified abnormal findings of blood chemistry: Secondary | ICD-10-CM

## 2024-06-24 NOTE — Progress Notes (Signed)
 PRENATAL VISIT NOTE- Centering Pregnancy Cycle 20 , Session # 4   Subjective:  Chelsea Herman is a 18 y.o. [id number] at [redacted]w[redacted]d being seen today for ongoing prenatal care through Centering Pregnancy.  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  Patient reports cramping, mostly in  upper left where placenta is located, less movement for this baby all the time than previous pregnancies but pt expresses concern today.  Contractions: Irregular. Vag. Bleeding: None.  Movement: (!) Decreased. Denies leaking of fluid/ROM.   The following portions of the patient's history were reviewed and updated as appropriate: allergies, current medications, past family history, past medical history, past social history, past surgical history and problem list. Problem list updated.  Objective:   Vitals:   06/24/24 0923  BP: 108/72  Pulse: 73  Weight: 192 lb 9.6 oz (87.4 kg)    Fetal Status: Fetal Heart Rate (bpm): 154 Fundal Height: 26 cm Movement: (!) Decreased  Presentation: Complete Breech (Bedside US )  General:  Alert, oriented and cooperative. Patient is in no acute distress.  Skin: Skin is warm and dry. No rash noted.   Cardiovascular: Normal heart rate noted  Respiratory: Normal respiratory effort, no problems with respiration noted  Abdomen: Soft, gravid, appropriate for gestational age.  Pain/Pressure: Present     Pelvic: Cervical exam deferred        Extremities: Normal range of motion.  Edema: None  Mental Status: Normal mood and affect. Normal behavior. Normal judgment and thought  content.   Assessment and Plan:  Pregnancy: [id number] at [redacted]w[redacted]d  1. Supervision of high risk pregnancy, antepartum (Primary) --Anticipatory guidance about next visits/weeks of pregnancy given.   Centering Pregnancy, Session#4: Reviewed resources in [employer].   Facilitated discussion today:  Breastfeeding/infant feeding with LC as Actor.  Fundal height and FHR appropriate today unless noted otherwise in plan. Patient to continue group care.    2. 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 History of gestational diabetes --GTT today  4. Decreased fetal movements in second trimester, single or unspecified fetus --Anterior placenta, movement seen on bedside US  today after Centering. Pt reassured. Reviewed kick counting (at 28 weeks) and reasons to go to MAU.  DFM is an appropriate reason to go to MAU if pt is worried.   5. [redacted] weeks gestation of pregnancy    Preterm labor symptoms and general obstetric precautions including but not limited to vaginal bleeding, contractions, leaking of fluid and fetal movement were reviewed in detail with the patient. Please refer to After Visit Summary for other counseling recommendations.  No follow-ups on file.  Future Appointments  Date Time Provider Department Center  07/08/2024  9:00 AM CENTERING PROVIDER [facility]  07/09/2024  1:00 PM WMC-MFC PROVIDER 1 [facility]  07/09/2024  1:30 PM WMC-MFC US4 [facility]  07/22/2024  9:00 AM CENTERING PROVIDER [facility]  08/01/2024  1:00 PM WMC-MFC PROVIDER 1 [facility]  08/01/2024  1:30 PM WMC-MFC US1 [facility]  08/05/2024  9:00 AM CENTERING PROVIDER [facility]  08/19/2024  9:00 AM CENTERING PROVIDER [facility]  08/29/2024  1:00 PM WMC-MFC PROVIDER 1 [facility]  08/29/2024  1:30 PM WMC-MFC US1 [facility]  09/02/2024  9:00 AM CENTERING PROVIDER [facility]  09/05/2024  2:15 PM WMC-MFC NST [facility]-M[facility]  09/12/2024  1:15 PM  WMC-MFC NST [facility]  09/16/2024  9:00 AM CENTERING PROVIDER [facility]  09/19/2024  1:15 PM WMC-MFC NST WMC-MFC W[facility]    Olam Boards, CNM

## 2024-06-25 ENCOUNTER — Ambulatory Visit: Payer: Self-pay | Admitting: Advanced Practice Midwife

## 2024-06-25 LAB — CBC
Hematocrit: 34.6 % (ref 34.0–46.6)
Hemoglobin: 10.9 g/dL — ABNORMAL LOW (ref 11.1–15.9)
MCH: 27.7 pg (ref 26.6–33.0)
MCHC: 31.5 g/dL (ref 31.5–35.7)
MCV: 88 fL (ref 79–97)
Platelets: 211 10*3/uL (ref 150–450)
RBC: 3.93 x10E6/uL (ref 3.77–5.28)
RDW: 13.1 % (ref 11.7–15.4)
WBC: 5 10*3/uL (ref 3.4–10.8)

## 2024-06-25 LAB — T4, FREE: Free T4: 0.86 ng/dL — ABNORMAL LOW (ref 0.93–1.60)

## 2024-06-25 LAB — GLUCOSE TOLERANCE, 2 HOURS W/ 1HR
Glucose, 1 hour: 113 mg/dL (ref 70–179)
Glucose, 2 hour: 105 mg/dL (ref 70–152)
Glucose, Fasting: 77 mg/dL (ref 70–91)

## 2024-06-25 LAB — TSH: TSH: 0.931 u[IU]/mL (ref 0.450–4.500)

## 2024-06-25 LAB — RPR: RPR Ser Ql: NONREACTIVE

## 2024-06-25 LAB — HIV ANTIBODY (ROUTINE TESTING W REFLEX): HIV Screen 4th Generation wRfx: NONREACTIVE

## 2024-06-25 LAB — T3, FREE: T3, Free: 2.8 pg/mL (ref 2.3–5.0)

## 2024-07-02 ENCOUNTER — Other Ambulatory Visit: Payer: Self-pay

## 2024-07-02 ENCOUNTER — Inpatient Hospital Stay (HOSPITAL_COMMUNITY)
Admission: AD | Admit: 2024-07-02 | Discharge: 2024-07-02 | Disposition: A | Discharge status: Home / Self Care | Attending: Obstetrics and Gynecology | Admitting: Obstetrics and Gynecology

## 2024-07-02 ENCOUNTER — Encounter (HOSPITAL_COMMUNITY): Payer: Self-pay | Admitting: Obstetrics and Gynecology

## 2024-07-02 DIAGNOSIS — Z8759 Personal history of other complications of pregnancy, childbirth and the puerperium: Secondary | ICD-10-CM | POA: Diagnosis not present

## 2024-07-02 DIAGNOSIS — O99512 Diseases of the respiratory system complicating pregnancy, second trimester: Secondary | ICD-10-CM | POA: Diagnosis not present

## 2024-07-02 DIAGNOSIS — O26899 Other specified pregnancy related conditions, unspecified trimester: Secondary | ICD-10-CM

## 2024-07-02 DIAGNOSIS — Z8632 Personal history of gestational diabetes: Secondary | ICD-10-CM | POA: Insufficient documentation

## 2024-07-02 DIAGNOSIS — J45909 Unspecified asthma, uncomplicated: Secondary | ICD-10-CM | POA: Diagnosis not present

## 2024-07-02 DIAGNOSIS — O43122 Velamentous insertion of umbilical cord, second trimester: Secondary | ICD-10-CM | POA: Diagnosis not present

## 2024-07-02 DIAGNOSIS — R102 Pelvic and perineal pain: Secondary | ICD-10-CM | POA: Insufficient documentation

## 2024-07-02 DIAGNOSIS — O09292 Supervision of pregnancy with other poor reproductive or obstetric history, second trimester: Secondary | ICD-10-CM | POA: Diagnosis not present

## 2024-07-02 DIAGNOSIS — O98512 Other viral diseases complicating pregnancy, second trimester: Secondary | ICD-10-CM | POA: Insufficient documentation

## 2024-07-02 DIAGNOSIS — R197 Diarrhea, unspecified: Secondary | ICD-10-CM | POA: Insufficient documentation

## 2024-07-02 DIAGNOSIS — M549 Dorsalgia, unspecified: Secondary | ICD-10-CM | POA: Diagnosis not present

## 2024-07-02 DIAGNOSIS — B009 Herpesviral infection, unspecified: Secondary | ICD-10-CM | POA: Diagnosis not present

## 2024-07-02 DIAGNOSIS — Z3A27 27 weeks gestation of pregnancy: Secondary | ICD-10-CM | POA: Diagnosis not present

## 2024-07-02 DIAGNOSIS — O26892 Other specified pregnancy related conditions, second trimester: Secondary | ICD-10-CM | POA: Insufficient documentation

## 2024-07-02 DIAGNOSIS — O99891 Other specified diseases and conditions complicating pregnancy: Secondary | ICD-10-CM

## 2024-07-02 LAB — URINALYSIS, ROUTINE W REFLEX MICROSCOPIC
Bilirubin Urine: NEGATIVE
Glucose, UA: NEGATIVE mg/dL
Hgb urine dipstick: NEGATIVE
Ketones, ur: NEGATIVE mg/dL
Leukocytes,Ua: NEGATIVE
Nitrite: NEGATIVE
Protein, ur: NEGATIVE mg/dL
Specific Gravity, Urine: 1.019 (ref 1.005–1.030)
pH: 6 (ref 5.0–8.0)

## 2024-07-02 LAB — CBC
HCT: 32 % — ABNORMAL LOW (ref 36.0–46.0)
Hemoglobin: 10.7 g/dL — ABNORMAL LOW (ref 12.0–15.0)
MCH: 27.7 pg (ref 26.0–34.0)
MCHC: 33.4 g/dL (ref 30.0–36.0)
MCV: 82.9 fL (ref 80.0–100.0)
Platelets: 216 K/uL (ref 150–400)
RBC: 3.86 MIL/uL — ABNORMAL LOW (ref 3.87–5.11)
RDW: 13.2 % (ref 11.5–15.5)
WBC: 5.5 K/uL (ref 4.0–10.5)
nRBC: 0 % (ref 0.0–0.2)

## 2024-07-02 LAB — COMPREHENSIVE METABOLIC PANEL WITH GFR
ALT: 11 U/L (ref 0–44)
AST: 17 U/L (ref 15–41)
Albumin: 2.9 g/dL — ABNORMAL LOW (ref 3.5–5.0)
Alkaline Phosphatase: 65 U/L (ref 38–126)
Anion gap: 11 (ref 5–15)
BUN: <5 mg/dL — ABNORMAL LOW (ref 6–20)
CO2: 21 mmol/L — ABNORMAL LOW (ref 22–32)
Calcium: 8.9 mg/dL (ref 8.9–10.3)
Chloride: 106 mmol/L (ref 98–111)
Creatinine, Ser: 0.59 mg/dL (ref 0.44–1.00)
GFR, Estimated: >60 mL/min (ref >60)
Glucose, Bld: 116 mg/dL — ABNORMAL HIGH (ref 70–99)
Potassium: 3.5 mmol/L (ref 3.5–5.1)
Sodium: 138 mmol/L (ref 135–145)
Total Bilirubin: 0.3 mg/dL (ref 0.0–1.2)
Total Protein: 5.9 g/dL — ABNORMAL LOW (ref 6.5–8.1)

## 2024-07-02 LAB — WET PREP, GENITAL
Clue Cells Wet Prep HPF POC: NONE SEEN
Sperm: NONE SEEN
Trich, Wet Prep: NONE SEEN
WBC, Wet Prep HPF POC: <10 (ref <10)
Yeast Wet Prep HPF POC: NONE SEEN

## 2024-07-02 LAB — POCT FERN TEST: POCT Fern Test: NEGATIVE

## 2024-07-02 MED ORDER — CYCLOBENZAPRINE HCL 10 MG PO TABS: 10.0000 mg | ORAL_TABLET | Freq: Two times a day (BID) | ORAL | 1 refills | Status: DC | PRN

## 2024-07-02 MED ORDER — ONDANSETRON 4 MG PO TBDP: 4.0000 mg | ORAL_TABLET | Freq: Four times a day (QID) | ORAL | 1 refills | Status: DC | PRN

## 2024-07-02 MED ORDER — ACETAMINOPHEN 500 MG PO TABS
1000.0000 mg | ORAL_TABLET | Freq: Once | ORAL | Status: AC
  Administered 2024-07-02: 1000 mg via ORAL
  Filled 2024-07-02: qty 2

## 2024-07-02 MED ORDER — CYCLOBENZAPRINE HCL 5 MG PO TABS
10.0000 mg | ORAL_TABLET | Freq: Once | ORAL | Status: AC
  Administered 2024-07-02: 10 mg via ORAL
  Filled 2024-07-02: qty 2

## 2024-07-02 MED ORDER — ONDANSETRON 4 MG PO TBDP
4.0000 mg | ORAL_TABLET | Freq: Four times a day (QID) | ORAL | Status: DC | PRN
  Administered 2024-07-02: 4 mg via ORAL
  Filled 2024-07-02: qty 1

## 2024-07-02 MED ORDER — LACTATED RINGERS IV BOLUS: 1000.0000 mL | Freq: Once | INTRAVENOUS | Status: DC

## 2024-07-02 MED ORDER — ACETAMINOPHEN 500 MG PO TABS: 1000.0000 mg | ORAL_TABLET | Freq: Four times a day (QID) | ORAL | 2 refills | Status: AC | PRN

## 2024-07-02 NOTE — Discharge Instructions (Signed)
 BACK PAIN: Rest Use ice/heat/warm bath Increase PO fluids, aim for 80-100 ounces of fluid intake each day Tylenol  is safe to take for pain. You can take 1000 mg every 6 hours or 500 mg every 4 hours. Do not take more than 4000 mg total in 24 hours. Pregnancy support belt Pregnancy Yoga: There are free options through YouTube and you can purchase DVDs easily.  Float therapy: This involves soaking in a warm bath of magnesium to help relax muscles. Local places that have this option are [facility] in [place] or [facility] Massage therapy: This is safe in pregnancy. Just assure your therapist is trained in prenatal massage. Some local options include [employer] and [facility] Chiropractic care: This involves re-aligning the bones and muscles of the body. You want to make sure the practitioner has training in pregnancy ([employer] Method).  A local option is [facility] [facility] at [employer] and [employer]. [phone] [email or web address] and there are many other chiropractic offices that do adjustments in pregnancy  BACK PAIN EXERCISES Stomach tone  Lie on your front with your arms by your side, head on one side. Pull in your stomach muscles, centered around your belly button. Hold for five seconds. Repeat three times. Build up to 10 seconds and repeat during the day, while walking or standing. Keep breathing during this exercise!  Pelvic tilt  Lie down with your knees bent. Tighten your stomach muscles, flattening your back against the floor. Hold for five seconds. Repeat five times.  Knee rolls  Lie on your back with your knees bent and your feet together. Roll your knees to one side, keeping your shoulders flat on the bed or floor, and hold for 10 seconds. Roll your knees back to the starting position, and then over to the other side and repeat. Do this exercise three times on each side.  Knees to chest  Lie  on your back, with your knees bent and feet flat on the floor or bed. Bring one knee up and use your hands to pull it gently towards your chest. Hold the leg in position for five seconds, and then relax. Repeat this exercise with the other knee. Do the exercise five times on each side.  Buttock tone  Lie on your front and bend one leg up behind you. Lift your bent knee just off the floor. Hold for up to eight seconds. Repeat five times each side.   Deep stomach muscle tone  Kneel on all fours with a small curve in your lower back. Let your stomach relax completely. Pull the lower part of your stomach upwards so that you lift your back (without arching it) away from the floor. Hold for 10 seconds. Keep breathing! Repeat 10 times.  Back stabilizer  Kneel on all fours with your back straight. Tighten your stomach. Keeping your back in this position, raise one arm in front of you and hold for 10 seconds. Try to keep your pelvis level and don't rotate your body. Repeat 10 times each side. To progress, try lifting one leg behind you instead of raising your arm.  Leg raise  Lie face down, though you might want to turn your head to one side if this is more comfortable. Tighten your stomach and buttock muscles to lift one leg slightly off the floor, while keeping your hips flat on the ground. Hold this position for 5 to 10 seconds and repeat 3 times.   Arm  raise  Lie on your stomach with your back in a neutral position. Tense the muscles in your lower stomach and raise one arm upwards. Hold this position for five seconds, and then relax your arm. Repeat this exercise 10 times with each arm.  Hamstring stretch  Steady yourself, then put one leg up on a chair. Keeping your raised leg straight, bend the supporting knee forward to stretch your hamstrings. Repeat three times each side. Please note: For those with acute sciatica this hamstring stretch may also pull on the sciatic nerve, making it feel  worse. If in doubt, ask a physiotherapist if this exercise is suitable for you.  One-leg stand  Steady yourself with one hand on a wall or work surface for support. Bend one leg up behind you. Hold your foot for 10 seconds and repeat three times each side. Try to keep your knees and thighs level with one another.  Deep lunge  Kneel on one knee, the other foot in front. Lift your back knee up, making sure you keep looking forwards. Push your hips forward. Hold for five seconds and repeat three times each side. Try to keep your upper body upright, avoid bending or leaning your upper body forwards.

## 2024-07-02 NOTE — MAU Provider Note (Signed)
 Chief Complaint:  Abdominal Pain, Back Pain, Vaginal Discharge, and Diarrhea   HPI   None     Chelsea Herman is a 18 y.o. [id number] at [redacted]w[redacted]d who presents to maternity admissions reporting pelvic pain, back pain, increased vaginal discharge, leaking of fluid, and 7 episodes of diarrhea in the past day.  She endorses some fetal movement, denies vaginal bleeding.  She has a belly band but it has not helped with the abdominal pain or back pain. She describes the abdominal/pelvic pain as increased vaginal pressure. Her back pain hurts midline and radiates down both legs.  Pregnancy Course: [facility]ce[facility] care at [facility]. Prenatal records reviewed.  Pregnancy complicated by velamentous insertion of umbilical cord, HSV-2, history of gestational hypertension, history of gestational diabetes, asthma.  Past Medical History:  Diagnosis Date   Asthma    as a child-no current medications or treatments   Diabetes mellitus without complication (HCC)    Gestational diabetes    History of gestational hypertension 02/04/2021   Hypertension    Migraines    OB History  Gravida Para Term Preterm AB Living  4 2 2  0 1 2  SAB IAB Ectopic Multiple Live Births  1 0 0 0 2    # Outcome Date GA Lbr Len/2nd Weight Sex Type Anes PTL Lv  4 Current           3 SAB 11/13/23 [redacted]w[redacted]d         2 Term 01/02/23 [redacted]w[redacted]d 01:00 3600 g M Vag-Spont None  LIV     Birth Comments: GDM  1 Term 02/04/21 [redacted]w[redacted]d 03:34 / 00:31 3464 g F Vag-Spont EPI  LIV     Birth Comments: GHTN   Past Surgical History:  Procedure Laterality Date   NO PAST SURGERIES     Family History  Problem Relation Age of Onset   Hearing loss Mother    Asthma Neg Hx    Cancer Neg Hx    Diabetes Neg Hx    Stroke Neg Hx    Social History   Tobacco Use   Smoking status: Never    Passive exposure: Never   Smokeless tobacco: Never  Vaping Use   Vaping status: Never Used  Substance Use Topics   Alcohol use: Never   Drug use: Never   Allergies  Allergen Reactions    Peanut-Containing Drug Products Anaphylaxis   Citric Acid Rash   Medications Prior to Admission  Medication Sig Dispense Refill Last Dose/Taking   aspirin  EC 81 MG tablet Take 1 tablet (81 mg total) by mouth daily. Swallow whole. 30 tablet 12    Blood Pressure Monitoring (BLOOD PRESSURE KIT) DEVI 1 Device by Does not apply route as needed. 1 each 0    Prenatal Vit-Fe Fumarate-FA (PREPLUS) 27-1 MG TABS Take 1 tablet by mouth daily. 30 tablet 13     I have reviewed patient's Past Medical Hx, Surgical Hx, Family Hx, Social Hx, medications and allergies.   ROS  Pertinent items noted in HPI and remainder of comprehensive ROS otherwise negative.   PHYSICAL EXAM  Patient Vitals for the past 24 hrs:  BP Temp Pulse Resp SpO2 Height Weight  07/02/24 1405 110/67 98.8 F (37.1 C) 92 16 100 % 5' 5 (1.651 m) 86.6 kg    Constitutional: Well-developed, well-nourished female in no acute distress.  HEENT: atraumatic, normocephalic. Neck has normal ROM. EOM intact. Cardiovascular: normal rate & rhythm, warm and well-perfused Respiratory: normal effort, no problems with respiration noted GI: Abd soft,  non-tender, non-distended MSK: Extremities nontender, no edema, normal ROM Skin: warm and dry. Acyanotic, no jaundice or pallor. Neurologic: Alert and oriented x 4. No abnormal coordination. Psychiatric: Normal mood. Speech not slurred, not rapid/pressured. Patient is cooperative. GU: no CVA tenderness Pelvic: Cervical exam performed with chaperone Maurine Bliss RN present. Dilation: Fingertip Effacement (%): 0 Station: -2 Exam by:: Joesph Sear PA-C  Fetal Tracing: Baseline FHR: 150 per minute Fetal heart variability: moderate Fetal Heart Rate accelerations: yes Fetal Heart Rate decelerations: none Fetal Non-stress Test: Category I (reactive) Toco:   Labs: Results for orders placed or performed during the hospital encounter of 07/02/24 (from the past 24 hours)  Urinalysis, Routine w  reflex microscopic -Urine, Clean Catch     Status: Abnormal   Collection Time: 07/02/24  2:18 PM  Result Value Ref Range   Color, Urine YELLOW YELLOW   APPearance HAZY (A) CLEAR   Specific Gravity, Urine 1.019 1.005 - 1.030   pH 6.0 5.0 - 8.0   Glucose, UA NEGATIVE NEGATIVE mg/dL   Hgb urine dipstick NEGATIVE NEGATIVE   Bilirubin Urine NEGATIVE NEGATIVE   Ketones, ur NEGATIVE NEGATIVE mg/dL   Protein, ur NEGATIVE NEGATIVE mg/dL   Nitrite NEGATIVE NEGATIVE   Leukocytes,Ua NEGATIVE NEGATIVE  Wet prep, genital     Status: None   Collection Time: 07/02/24  2:18 PM   Specimen: PATH Cytology Cervicovaginal Ancillary Only  Result Value Ref Range   Yeast Wet Prep HPF POC NONE SEEN NONE SEEN   Trich, Wet Prep NONE SEEN NONE SEEN   Clue Cells Wet Prep HPF POC NONE SEEN NONE SEEN   WBC, Wet Prep HPF POC <10 <10   Sperm NONE SEEN   CBC     Status: Abnormal   Collection Time: 07/02/24  2:21 PM  Result Value Ref Range   WBC 5.5 4.0 - 10.5 K/uL   RBC 3.86 (L) 3.87 - 5.11 MIL/uL   Hemoglobin 10.7 (L) 12.0 - 15.0 g/dL   HCT 67.9 (L) 63.9 - 53.9 %   MCV 82.9 80.0 - 100.0 fL   MCH 27.7 26.0 - 34.0 pg   MCHC 33.4 30.0 - 36.0 g/dL   RDW 86.7 88.4 - 84.4 %   Platelets 216 150 - 400 K/uL   nRBC 0.0 0.0 - 0.2 %  Comprehensive metabolic panel with GFR     Status: Abnormal   Collection Time: 07/02/24  2:21 PM  Result Value Ref Range   Sodium 138 135 - 145 mmol/L   Potassium 3.5 3.5 - 5.1 mmol/L   Chloride 106 98 - 111 mmol/L   CO2 21 (L) 22 - 32 mmol/L   Glucose, Bld 116 (H) 70 - 99 mg/dL   BUN <5 (L) 6 - 20 mg/dL   Creatinine, Ser 9.40 0.44 - 1.00 mg/dL   Calcium 8.9 8.9 - 89.6 mg/dL   Total Protein 5.9 (L) 6.5 - 8.1 g/dL   Albumin 2.9 (L) 3.5 - 5.0 g/dL   AST 17 15 - 41 U/L   ALT 11 0 - 44 U/L   Alkaline Phosphatase 65 38 - 126 U/L   Total Bilirubin 0.3 0.0 - 1.2 mg/dL   GFR, Estimated >39 >39 mL/min   Anion gap 11 5 - 15  Fern Test     Status: Normal   Collection Time: 07/02/24  3:17  PM  Result Value Ref Range   POCT Fern Test Negative = intact amniotic membranes     Imaging:  No results found.  MDM & MAU COURSE  MDM: High  MAU Course: -Vital signs within normal limits.  -UA and wet prep to rule out infection. Both negative. -CBC shows anemia, start PO iron. CMP within normal limits for pregnancy. -Fern to rule out rupture. Fern negative. -Tylenol  and Flexeril  for pain. -Cervix closed and thick. Suspect pelvic pressure is from fetal position settling lower in pelvis. -Minimal improvement with Tylenol  and Flexeril   Differential diagnosis considered for abdominal pain includes but is not limited to: round ligament pain, labor, UTI, pyelonephritis, PID, cervicitis  Orders Placed This Encounter  Procedures   Wet prep, genital   CBC   Comprehensive metabolic panel with GFR   Urinalysis, Routine w reflex microscopic -Urine, Clean Catch   Fern Test   Meds ordered this encounter  Medications   acetaminophen  (TYLENOL ) tablet 1,000 mg   cyclobenzaprine  (FLEXERIL ) tablet 10 mg   DISCONTD: lactated ringers  bolus 1,000 mL   ondansetron  (ZOFRAN -ODT) disintegrating tablet 4 mg   ondansetron  (ZOFRAN -ODT) 4 MG disintegrating tablet    Sig: Take 1 tablet (4 mg total) by mouth every 6 (six) hours as needed for nausea or vomiting.    Dispense:  20 tablet    Refill:  1   cyclobenzaprine  (FLEXERIL ) 10 MG tablet    Sig: Take 1 tablet (10 mg total) by mouth 2 (two) times daily as needed for muscle spasms.    Dispense:  20 tablet    Refill:  1   acetaminophen  (TYLENOL ) 500 MG tablet    Sig: Take 2 tablets (1,000 mg total) by mouth every 6 (six) hours as needed for mild pain (pain score 1-3).    Dispense:  60 tablet    Refill:  2    ASSESSMENT   1. Pain of round ligament affecting pregnancy, antepartum   2. Back pain affecting pregnancy   3. [redacted] weeks gestation of pregnancy     PLAN  Discharge home in stable condition with return precautions.  Discussed options  for back and sciatic pain including PT, chiropractor, prenatal yoga, Tylenol , and Flexeril .    Follow-up Information     Center for [facility] Follow up.   Specialty: Obstetrics and Gynecology Why: As scheduled for ongoing prenatal care Contact information: [address] [place]  [address] [phone]                 Allergies as of 07/02/2024       Reactions   Peanut-containing Drug Products Anaphylaxis   Citric Acid Rash        Medication List     TAKE these medications    acetaminophen  500 MG tablet Commonly known as: TYLENOL  Take 2 tablets (1,000 mg total) by mouth every 6 (six) hours as needed for mild pain (pain score 1-3).   aspirin  EC 81 MG tablet Take 1 tablet (81 mg total) by mouth daily. Swallow whole.   Blood Pressure Kit Devi 1 Device by Does not apply route as needed.   cyclobenzaprine  10 MG tablet Commonly known as: FLEXERIL  Take 1 tablet (10 mg total) by mouth 2 (two) times daily as needed for muscle spasms.   ondansetron  4 MG disintegrating tablet Commonly known as: ZOFRAN -ODT Take 1 tablet (4 mg total) by mouth every 6 (six) hours as needed for nausea or vomiting.   PrePLUS 27-1 MG Tabs Take 1 tablet by mouth daily.        Joesph DELENA Sear, PA

## 2024-07-02 NOTE — MAU Note (Signed)
 Chelsea Herman is a 18 y.o. at [redacted]w[redacted]d here in MAU reporting: abd pain, back pain, increased vaginal discharge (mucous), leaking of fluid and diarrhea for the last day - 7 times. Reports some' FM, denies VB, pt has a belly band and it is not helping.   LMP: na Onset of complaint: 3 days Pain score: 6/10 Vitals:   07/02/24 1405  BP: 110/67  Pulse: 92  Resp: 16  Temp: 98.8 F (37.1 C)  SpO2: 100%     FHT: 155  Lab orders placed from triage: ua

## 2024-07-03 LAB — GC/CHLAMYDIA PROBE AMP (~~LOC~~) NOT AT ARMC
Chlamydia: NEGATIVE
Comment: NEGATIVE
Comment: NORMAL
Neisseria Gonorrhea: NEGATIVE

## 2024-07-08 ENCOUNTER — Encounter: Payer: Self-pay | Admitting: Advanced Practice Midwife

## 2024-07-08 ENCOUNTER — Ambulatory Visit (INDEPENDENT_AMBULATORY_CARE_PROVIDER_SITE_OTHER): Payer: Self-pay | Admitting: Advanced Practice Midwife

## 2024-07-08 VITALS — BP 92/66 | HR 76 | Wt 192.2 lb

## 2024-07-08 DIAGNOSIS — Z3A28 28 weeks gestation of pregnancy: Secondary | ICD-10-CM | POA: Diagnosis not present

## 2024-07-08 DIAGNOSIS — Z8759 Personal history of other complications of pregnancy, childbirth and the puerperium: Secondary | ICD-10-CM

## 2024-07-08 DIAGNOSIS — J4520 Mild intermittent asthma, uncomplicated: Secondary | ICD-10-CM

## 2024-07-08 DIAGNOSIS — O43123 Velamentous insertion of umbilical cord, third trimester: Secondary | ICD-10-CM

## 2024-07-08 DIAGNOSIS — Z1332 Encounter for screening for maternal depression: Secondary | ICD-10-CM

## 2024-07-08 DIAGNOSIS — R109 Unspecified abdominal pain: Secondary | ICD-10-CM

## 2024-07-08 DIAGNOSIS — M5431 Sciatica, right side: Secondary | ICD-10-CM

## 2024-07-08 DIAGNOSIS — R000 Tachycardia, unspecified: Secondary | ICD-10-CM

## 2024-07-08 DIAGNOSIS — O0990 Supervision of high risk pregnancy, unspecified, unspecified trimester: Secondary | ICD-10-CM

## 2024-07-08 DIAGNOSIS — O321XX0 Maternal care for breech presentation, not applicable or unspecified: Secondary | ICD-10-CM

## 2024-07-08 DIAGNOSIS — O0993 Supervision of high risk pregnancy, unspecified, third trimester: Secondary | ICD-10-CM | POA: Diagnosis not present

## 2024-07-08 DIAGNOSIS — Z8632 Personal history of gestational diabetes: Secondary | ICD-10-CM

## 2024-07-08 DIAGNOSIS — B009 Herpesviral infection, unspecified: Secondary | ICD-10-CM

## 2024-07-08 DIAGNOSIS — O26893 Other specified pregnancy related conditions, third trimester: Secondary | ICD-10-CM

## 2024-07-08 NOTE — Progress Notes (Addendum)
 PRENATAL VISIT NOTE  Subjective:  Chelsea Herman is a 18 y.o. H5E7987 at [redacted]w[redacted]d being seen today for ongoing prenatal care.  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  Patient reports intermittent periods of dizziness/lightheadedness. Has had some palpitations for which she was referred to Dr. Sheena. Has not yet been able to schedule. Recently went to MAU for pelvic pressure and intermittent cramping. Not ruptured; fingertip dilated. Denies leaking of fluid.   The following portions of the patient's history were reviewed and updated as appropriate: allergies, current medications, past family history, past medical history, past social history, past surgical history and problem list.   Objective:    Vitals:   07/08/24 0912 07/08/24 0947  BP: 96/70 92/66  Pulse:  76  Weight: 192 lb 3.2 oz (87.2 kg)     Fetal Status:  Fetal Heart Rate (bpm): 150 Fundal Height: 28 cm Movement: Present    General: Alert, oriented and cooperative. Patient is in no acute distress.  Skin: Skin is warm and dry. No rash noted.   Cardiovascular: Normal heart rate noted  Respiratory: Normal respiratory effort, no problems with respiration noted  Abdomen: Soft, gravid, appropriate for gestational age.  Pain/Pressure: Present     Pelvic: Cervical exam deferred        Extremities: Normal range of motion.  Edema: None  Mental Status: Normal mood and affect. Normal behavior. Normal judgment and thought content.   Assessment and Plan:  Pregnancy: [id number] at [redacted]w[redacted]d  1. Supervision of high risk pregnancy,  antepartum (Primary) --Anticipatory guidance about next visits/weeks of pregnancy given.    Centering Pregnancy, Session#5: Reviewed resources in [employer].   Facilitated discussion today:  Signs of labor, labor positions, coping strategies/support measures.  Fundal height and FHR appropriate today unless noted otherwise in plan. Patient to continue group care.     2. Tachycardia --Pt with recent dizziness, Hgb stable at 10.7 on 7/8.  Pt missed appt with Dr Sheena, will message cardiology and get pt scheduled ASAP.   3. 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  4. Mild intermittent asthma without complication Well-controlled currently.  5. HSV-2 (herpes simplex virus 2) infection Will start prophylaxis at 36 weeks.  6. History of gestational hypertension Normotensive this pregnancy.  7. History of gestational diabetes GTT normal this pregnancy.  8. Breech presentation, single or unspecified fetus Discussed natural ways to encouraged turning, and MyChart message sent with additional resources.   9. Abdominal pain during pregnancy in third trimester Constant pressure, burning in lower abdomen Discussed ways to change baby position (see breech) Rest/ice/heat/warm bath/increase PO fluids/Tylenol /pregnancy support belt    10. Sciatica Exercises reviewed,  PT referral placed.  Preterm labor symptoms and general obstetric precautions including but not limited to vaginal bleeding, contractions, leaking of fluid and fetal movement were reviewed in detail with the patient. Please refer to After Visit Summary for other counseling recommendations.   Return for Centering Sessions as scheduled.  Future Appointments  Date Time Provider Department Center  07/09/2024  1:00 PM [facility] PROVIDER 1 [facility]  07/09/2024  1:30 PM [facility] US4 [facility]  07/22/2024  9:00 AM CENTERING PROVIDER  [facility]  08/01/2024  1:00 PM [facility]  PROVIDER 1 [facility]  08/01/2024  1:30 PM [facility] US1 [facility]  08/05/2024  9:00 AM CENTERING PROVIDER [facility]  08/19/2024  9:00 AM CENTERING PROVIDER [facility]  08/29/2024  1:00 PM [facility] PROVIDER 1 [facility]  08/29/2024  1:30 PM [facility] US1 [facility]  09/02/2024  9:00 AM CENTERING PROVIDER [facility]  09/05/2024  2:15 PM [facility] NST [facility]  09/12/2024  1:15 PM [facility] NST [facility]  09/16/2024  9:00 AM CENTERING PROVIDER [facility]  09/19/2024  1:15 PM [facility] NST [facility]    Olam Boards, CNM

## 2024-07-08 NOTE — Progress Notes (Signed)
 Pregnanancy risk screening form completed,and PHQ9/Gad7/ [employer] PEAK

## 2024-07-08 NOTE — Addendum Note (Signed)
 Addended by: MILLY PLANAS A on: 07/08/2024 12:53 PM   Modules accepted: Orders

## 2024-07-09 ENCOUNTER — Ambulatory Visit: Attending: Obstetrics and Gynecology | Admitting: Obstetrics and Gynecology

## 2024-07-09 ENCOUNTER — Ambulatory Visit (HOSPITAL_BASED_OUTPATIENT_CLINIC_OR_DEPARTMENT_OTHER)

## 2024-07-09 VITALS — BP 113/64 | HR 72

## 2024-07-09 DIAGNOSIS — O43123 Velamentous insertion of umbilical cord, third trimester: Secondary | ICD-10-CM

## 2024-07-09 DIAGNOSIS — O209 Hemorrhage in early pregnancy, unspecified: Secondary | ICD-10-CM

## 2024-07-09 DIAGNOSIS — O99513 Diseases of the respiratory system complicating pregnancy, third trimester: Secondary | ICD-10-CM | POA: Diagnosis not present

## 2024-07-09 DIAGNOSIS — Z362 Encounter for other antenatal screening follow-up: Secondary | ICD-10-CM | POA: Insufficient documentation

## 2024-07-09 DIAGNOSIS — O09293 Supervision of pregnancy with other poor reproductive or obstetric history, third trimester: Secondary | ICD-10-CM | POA: Diagnosis not present

## 2024-07-09 DIAGNOSIS — E669 Obesity, unspecified: Secondary | ICD-10-CM

## 2024-07-09 DIAGNOSIS — Z8759 Personal history of other complications of pregnancy, childbirth and the puerperium: Secondary | ICD-10-CM | POA: Insufficient documentation

## 2024-07-09 DIAGNOSIS — O0990 Supervision of high risk pregnancy, unspecified, unspecified trimester: Secondary | ICD-10-CM

## 2024-07-09 DIAGNOSIS — J45909 Unspecified asthma, uncomplicated: Secondary | ICD-10-CM

## 2024-07-09 DIAGNOSIS — Z8632 Personal history of gestational diabetes: Secondary | ICD-10-CM | POA: Insufficient documentation

## 2024-07-09 DIAGNOSIS — Z3A28 28 weeks gestation of pregnancy: Secondary | ICD-10-CM | POA: Insufficient documentation

## 2024-07-09 DIAGNOSIS — O99213 Obesity complicating pregnancy, third trimester: Secondary | ICD-10-CM

## 2024-07-09 NOTE — Progress Notes (Signed)
 Maternal-Fetal Medicine Consultation Name: Chelsea Herman MRN: [id number]  G4 E7987 at 28w 5d gestation.  Patient is here for fetal growth assessment.  Velamentous cord insertion was seen at previous ultrasound. She does not have gestational diabetes. Obstetrical history significant for gestational hypertension in her second pregnancy.  Blood pressure today at our office is 113/64 mmHg.  Ultrasound Fetal growth is appropriate for gestational age.  Amniotic fluid is normal good fetal activity seen.  Velamentous cord insertion/marginal cord insertion is seen.  Patient had questions about abnormal cord insertion site.  I explained the velamentous/marginal cord insertion with the help of ultrasound images and diagrams.  I reassured her of normal fetal growth assessment.  In a small number of cases, fetal growth restriction can be seen.  We recommend weekly antenatal testing from [redacted] weeks gestation until delivery.  Recommendations - Patient has an appointment for fetal growth assessment.  Consultation including face-to-face (more than 50%) counseling 10 minutes.

## 2024-07-20 ENCOUNTER — Inpatient Hospital Stay (HOSPITAL_COMMUNITY)
Admission: AD | Admit: 2024-07-20 | Discharge: 2024-07-20 | Disposition: A | Discharge status: Home / Self Care | Attending: Family Medicine | Admitting: Family Medicine

## 2024-07-20 ENCOUNTER — Encounter (HOSPITAL_COMMUNITY): Payer: Self-pay | Admitting: Family Medicine

## 2024-07-20 ENCOUNTER — Other Ambulatory Visit: Payer: Self-pay

## 2024-07-20 DIAGNOSIS — O368130 Decreased fetal movements, third trimester, not applicable or unspecified: Secondary | ICD-10-CM | POA: Diagnosis present

## 2024-07-20 DIAGNOSIS — Z3A30 30 weeks gestation of pregnancy: Secondary | ICD-10-CM

## 2024-07-20 NOTE — MAU Provider Note (Incomplete)
 Chief Complaint:  Decreased Fetal Movement   HPI     Chelsea Herman is a 18 y.o. [phone] at [redacted]w[redacted]d who presents to maternity admissions reporting that she has not felt the baby move as normal for the past 2 days.  She does acknowledge some movement.  Patient denies any other complaints.  No vaginal bleeding, no leaking of fluid, no urinary or vaginal discharge comfort.  She offers no other complaints at this time  [facility] (cent[facility] group)  Past Medical History:  Diagnosis Date  . Asthma    as a child-no current medications or treatments  . Diabetes mellitus without complication (HCC)   . Gestational diabetes   . History of gestational hypertension 02/04/2021  . Hypertension   . Migraines    OB History  Gravida Para Term Preterm AB Living  4 2 2  0 1 2  SAB IAB Ectopic Multiple Live Births  1 0 0 0 2    # Outcome Date GA Lbr Len/2nd Weight Sex Type Anes PTL Lv  4 Current           3 SAB 11/13/23 [redacted]w[redacted]d         2 Term 01/02/23 [redacted]w[redacted]d 01:00 3600 g M Vag-Spont None  LIV     Birth Comments: GDM  1 Term 02/04/21 [redacted]w[redacted]d 03:34 / 00:31 3464 g F Vag-Spont EPI  LIV     Birth Comments: GHTN   Past Surgical History:  Procedure Laterality Date  . NO PAST SURGERIES     Family History  Problem Relation Age of Onset  . Hearing loss Mother   . Asthma Neg Hx   . Cancer Neg Hx   . Diabetes Neg Hx   . Stroke Neg Hx    Social History   Tobacco Use  . Smoking status: Never    Passive exposure: Never  . Smokeless tobacco: Never  Vaping Use  . Vaping status: Never Used  Substance Use Topics  . Alcohol use: Never  . Drug use: Never   Allergies  Allergen Reactions  . Peanut-Containing Drug Products Anaphylaxis  . Citric Acid Rash   Medications Prior to Admission  Medication Sig Dispense Refill Last Dose/Taking  . acetaminophen  (TYLENOL ) 500 MG tablet Take 2 tablets (1,000 mg total) by mouth every 6 (six) hours as needed for mild pain (pain  score 1-3). 60 tablet 2   . aspirin  EC 81 MG tablet Take 1 tablet (81 mg total) by mouth daily. Swallow whole. 30 tablet 12   . Blood Pressure Monitoring (BLOOD PRESSURE KIT) DEVI 1 Device by Does not apply route as needed. 1 each 0   . cyclobenzaprine  (FLEXERIL ) 10 MG tablet Take 1 tablet (10 mg total) by mouth 2 (two) times daily as needed for muscle spasms. 20 tablet 1   . ondansetron  (ZOFRAN -ODT) 4 MG disintegrating tablet Take 1 tablet (4 mg total) by mouth every 6 (six) hours as needed for nausea or vomiting. 20 tablet 1   . Prenatal Vit-Fe Fumarate-FA (PREPLUS) 27-1 MG TABS Take 1 tablet by mouth daily. 30 tablet 13     I have reviewed patient's Past Medical Hx, Surgical Hx, Family Hx, Social Hx, medications and allergies.   ROS  Pertinent items noted in HPI and remainder of comprehensive ROS otherwise negative.   PHYSICAL EXAM  Patient Vitals for the past 24 hrs:  BP Temp Temp src Pulse Resp SpO2  07/20/24 2252 -- -- -- -- -- 99 %  07/20/24 2251 118/72 98.1 F (36.7 C) Oral (!) 115 17 --    Constitutional: Well-developed, obese female in no acute distress.  Cardiovascular: normal rate & rhythm, warm and well-perfused Respiratory: normal effort, no problems with respiration noted GI: Abd soft, non-tender, gravid MS: Extremities nontender, no edema, normal ROM Neurologic: Alert and oriented x 4.  Pelvic: Deferred      Fetal Tracing: Cat 1 reactive Baseline: 140-145 Variability: moderate Accelerations: present Decelerations: absent Toco: none   Labs: No results found for this or any previous visit (from the past 24 hours).  Imaging:  No results found.  MDM & MAU COURSE  MDM:  MODERATE   Decreased fetal movement at 30 weeks 2 days Prenatal chart reviewed Physical exam performed NST for gestational age and fetal reassurance Patient is acknowledging fetal movement with clicker on NST Plan to discharge home with precautions and fetal kick counts   I have  reviewed the patient chart and performed the physical exam . I have ordered & interpreted the lab results and reviewed and interpreted the NST Medications ordered as stated below.  A/P as described below.  Counseling and education provided and patient agreeable  with plan as described below. Verbalized understanding.    ASSESSMENT   1. Decreased fetal movements in third trimester, single or unspecified fetus   2. [redacted] weeks gestation of pregnancy     PLAN  Discharge home in stable condition with return precautions.   Fetal kick counts reviewed   See AVS for full description of information given to the patient including both verbal and written. Patient verbalized understanding and agrees with the plan as described above.     Follow-up Information     [facility] Follow up.   Specialty: Obstetrics and Gynecology Why: If symptoms worsen or fail to resolve, As scheduled for ongoing prenatal care Contact information: [address] [place]  [address] [phone]                Allergies as of 07/20/2024       Reactions   Peanut-containing Drug Products Anaphylaxis   Citric Acid Rash        Medication List     TAKE these medications    acetaminophen  500 MG tablet Commonly known as: TYLENOL  Take 2 tablets (1,000 mg total) by mouth every 6 (six) hours as needed for mild pain (pain score 1-3).   aspirin  EC 81 MG tablet Take 1 tablet (81 mg total) by mouth daily. Swallow whole.   Blood Pressure Kit Devi 1 Device by Does not apply route as needed.   cyclobenzaprine  10 MG tablet Commonly known as: FLEXERIL  Take 1 tablet (10 mg total) by mouth 2 (two) times daily as needed for muscle spasms.   ondansetron  4 MG disintegrating tablet Commonly known as: ZOFRAN -ODT Take 1 tablet (4 mg total) by mouth every 6 (six) hours as needed for nausea or vomiting.   PrePLUS 27-1 MG Tabs Take 1 tablet by mouth  daily.        Olam Dalton, MSN, [facility], Center for [employer]

## 2024-07-20 NOTE — MAU Note (Signed)
 Pt here c/o of DFM for two days. No other complaints

## 2024-07-20 NOTE — MAU Provider Note (Signed)
 Chief Complaint:  No chief complaint on file.   HPI      Chelsea Herman is a 18 y.o. H5E7987 at [redacted]w[redacted]d who presents to maternity admissions reporting ***.   Pregnancy Course: ***  Past Medical History:  Diagnosis Date   Asthma    as a child-no current medications or treatments   Diabetes mellitus without complication (HCC)    Gestational diabetes    History of gestational hypertension 02/04/2021   Hypertension    Migraines    OB History  Gravida Para Term Preterm AB Living  4 2 2  0 1 2  SAB IAB Ectopic Multiple Live Births  1 0 0 0 2    # Outcome Date GA Lbr Len/2nd Weight Sex Type Anes PTL Lv  4 Current           3 SAB 11/13/23 [redacted]w[redacted]d         2 Term 01/02/23 [redacted]w[redacted]d 01:00 3600 g M Vag-Spont None  LIV     Birth Comments: GDM  1 Term 02/04/21 [redacted]w[redacted]d 03:34 / 00:31 3464 g F Vag-Spont EPI  LIV     Birth Comments: GHTN   Past Surgical History:  Procedure Laterality Date   NO PAST SURGERIES     Family History  Problem Relation Age of Onset   Hearing loss Mother    Asthma Neg Hx    Cancer Neg Hx    Diabetes Neg Hx    Stroke Neg Hx    Social History   Tobacco Use   Smoking status: Never    Passive exposure: Never   Smokeless tobacco: Never  Vaping Use   Vaping status: Never Used  Substance Use Topics   Alcohol use: Never   Drug use: Never   Allergies  Allergen Reactions   Peanut-Containing Drug Products Anaphylaxis   Citric Acid Rash   Medications Prior to Admission  Medication Sig Dispense Refill Last Dose/Taking   acetaminophen  (TYLENOL ) 500 MG tablet Take 2 tablets (1,000 mg total) by mouth every 6 (six) hours as needed for mild pain (pain score 1-3). 60 tablet 2    aspirin  EC 81 MG tablet Take 1 tablet (81 mg total) by mouth daily. Swallow whole. 30 tablet 12    Blood Pressure Monitoring (BLOOD PRESSURE KIT) DEVI 1 Device by Does not apply route as needed. 1 each 0    cyclobenzaprine  (FLEXERIL ) 10 MG tablet Take 1 tablet (10 mg total) by mouth 2 (two) times  daily as needed for muscle spasms. 20 tablet 1    ondansetron  (ZOFRAN -ODT) 4 MG disintegrating tablet Take 1 tablet (4 mg total) by mouth every 6 (six) hours as needed for nausea or vomiting. 20 tablet 1    Prenatal Vit-Fe Fumarate-FA (PREPLUS) 27-1 MG TABS Take 1 tablet by mouth daily. 30 tablet 13     I have reviewed patient's Past Medical Hx, Surgical Hx, Family Hx, Social Hx, medications and allergies.   ROS  Pertinent items noted in HPI and remainder of comprehensive ROS otherwise negative.   PHYSICAL EXAM  No data found.  Constitutional: Well-developed, well-nourished female in no acute distress.  Cardiovascular: normal rate & rhythm, warm and well-perfused Respiratory: normal effort, no problems with respiration noted GI: Abd soft, non-tender, gravid MS: Extremities nontender, no edema, normal ROM Neurologic: Alert and oriented x 4.  GU: no CVA tenderness Pelvic: NEFG, physiologic discharge, no blood, cervix clean.      Fetal Tracing: Baseline: Variability: Accelerations:  Decelerations: Toco:    Labs:  No results found for this or any previous visit (from the past 24 hours).  Imaging:  No results found.  MDM & MAU COURSE  MDM:  MAU Course: No orders of the defined types were placed in this encounter.  No orders of the defined types were placed in this encounter.     I have reviewed the patient chart and performed the physical exam . I have ordered & interpreted the lab results and reviewed and interpreted the *** Medications ordered as stated below.  A/P as described below.  Counseling and education provided and patient agreeable  with plan as described below. Verbalized understanding.    ASSESSMENT  No diagnosis found.  PLAN  Discharge home in stable condition with return precautions.   See AVS for full description of information given to the patient including both verbal and written. Patient verbalized understanding and agrees with the plan as  described above.  ***    Allergies as of 07/20/2024       Reactions   Peanut-containing Drug Products Anaphylaxis   Citric Acid Rash     Med Rec must be completed prior to using this [employer]***       Olam Dalton, MSN, [facility], [facility] [employer]

## 2024-07-22 ENCOUNTER — Ambulatory Visit: Payer: Self-pay | Admitting: Advanced Practice Midwife

## 2024-07-22 ENCOUNTER — Other Ambulatory Visit

## 2024-07-22 VITALS — BP 109/72 | HR 88 | Wt 194.2 lb

## 2024-07-22 DIAGNOSIS — Z3A30 30 weeks gestation of pregnancy: Secondary | ICD-10-CM

## 2024-07-22 DIAGNOSIS — O0993 Supervision of high risk pregnancy, unspecified, third trimester: Secondary | ICD-10-CM

## 2024-07-22 DIAGNOSIS — O26893 Other specified pregnancy related conditions, third trimester: Secondary | ICD-10-CM | POA: Diagnosis not present

## 2024-07-22 DIAGNOSIS — R102 Pelvic and perineal pain: Secondary | ICD-10-CM

## 2024-07-22 DIAGNOSIS — O43123 Velamentous insertion of umbilical cord, third trimester: Secondary | ICD-10-CM | POA: Diagnosis not present

## 2024-07-22 DIAGNOSIS — O0990 Supervision of high risk pregnancy, unspecified, unspecified trimester: Secondary | ICD-10-CM

## 2024-07-22 NOTE — Progress Notes (Signed)
       PRENATAL VISIT NOTE- Centering Pregnancy Cycle 20, Session # 6  Subjective:  Chelsea Herman is a 18 y.o. H5E7987 at [redacted]w[redacted]d being seen today for ongoing prenatal care through Centering Pregnancy.  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  Patient reports no contractions. Patient seen at [facility] for pressure/contractions 7/21 (cervix closed) and MAU for DFM 7/26 (NST reactive). Contractions: Not present.  .  Movement: Present. Denies leaking of fluid/ROM.   The following portions of the patient's history were reviewed and updated as appropriate: allergies, current medications, past family history, past medical history, past social history, past surgical history and problem list. Problem list updated.  Objective:   Vitals:   07/22/24 0913  BP: 109/72  Pulse: 88  Weight: 194 lb 3.2 oz (88.1 kg)    Fetal Status: Fetal Heart Rate (bpm): 145 Fundal Height: 31 cm Movement: Present     General:  Alert, oriented and cooperative. Patient is in no acute distress.  Skin: Skin is warm and dry. No rash noted.   Cardiovascular: Normal heart rate noted  Respiratory: Normal respiratory effort, no problems with respiration noted  Abdomen: Soft, gravid, appropriate for gestational age.  Pain/Pressure: Absent     Pelvic: Cervical exam performed        Extremities: Normal range of motion.  Edema: None  Mental Status: Normal mood and affect. Normal behavior. Normal judgment and thought content.   Assessment and Plan:  Pregnancy: H5E7987 at [redacted]w[redacted]d   1. Supervision of high risk pregnancy,  antepartum (Primary) --Anticipatory guidance about next visits/weeks of pregnancy given.    Centering Pregnancy, Session#6: Reviewed resources in [employer].  Facilitated discussion today: family planning/reproductive life planning, coping strategies   Mindfulness activity with positive affirmations   Fundal height and FHR appropriate today unless noted otherwise in plan. Patient to continue group care.   2. 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  3. [redacted] weeks gestation of pregnancy   4. Pelvic pressure in pregnancy, antepartum, third trimester Triage visit at [facility] on 7/21, pt reports feeling better today F/U with PT as scheduled  Preterm labor symptoms and general obstetric precautions including but not limited to vaginal bleeding, contractions, leaking of fluid and fetal movement were reviewed in detail with the patient. Please refer to After Visit Summary for other counseling recommendations.  Return for Centering Sessions as scheduled.  Future Appointments  Date Time Provider Department Center  08/01/2024  1:00 PM [facility] PROVIDER 1 [facility]  08/01/2024  1:30 PM [facility] US1 [facility]  08/05/2024  9:00 AM CENTERING PROVIDER [facility]  08/19/2024  9:00 AM CENTERING PROVIDER [facility]  08/29/2024  1:00 PM [facility] PROVIDER 1 [facility]  08/29/2024  1:30 PM [facility] US1 [facility]  09/02/2024  9:00 AM CENTERING PROVIDER [facility]  09/05/2024  2:15 PM [facility] NST [facility]  09/12/2024  1:15 PM [facility] NST [facility]  09/16/2024  9:00 AM CENTERING PROVIDER [facility]  09/19/2024  1:15 PM [facility] NST [facility]    Olam Boards, CNM

## 2024-07-28 ENCOUNTER — Inpatient Hospital Stay (HOSPITAL_COMMUNITY)
Admission: AD | Admit: 2024-07-28 | Discharge: 2024-07-28 | Disposition: A | Discharge status: Home / Self Care | Payer: Self-pay | Attending: Obstetrics & Gynecology | Admitting: Obstetrics & Gynecology

## 2024-07-28 ENCOUNTER — Other Ambulatory Visit: Payer: Self-pay

## 2024-07-28 ENCOUNTER — Encounter (HOSPITAL_COMMUNITY): Payer: Self-pay | Admitting: Obstetrics & Gynecology

## 2024-07-28 DIAGNOSIS — N898 Other specified noninflammatory disorders of vagina: Secondary | ICD-10-CM | POA: Diagnosis present

## 2024-07-28 DIAGNOSIS — R102 Pelvic and perineal pain: Secondary | ICD-10-CM | POA: Insufficient documentation

## 2024-07-28 DIAGNOSIS — O26893 Other specified pregnancy related conditions, third trimester: Secondary | ICD-10-CM | POA: Insufficient documentation

## 2024-07-28 DIAGNOSIS — Z0371 Encounter for suspected problem with amniotic cavity and membrane ruled out: Secondary | ICD-10-CM

## 2024-07-28 DIAGNOSIS — Z3A33 33 weeks gestation of pregnancy: Secondary | ICD-10-CM | POA: Insufficient documentation

## 2024-07-28 LAB — URINALYSIS, ROUTINE W REFLEX MICROSCOPIC
Bilirubin Urine: NEGATIVE
Glucose, UA: NEGATIVE mg/dL
Hgb urine dipstick: NEGATIVE
Ketones, ur: NEGATIVE mg/dL
Leukocytes,Ua: NEGATIVE
Nitrite: NEGATIVE
Protein, ur: 30 mg/dL — AB
Specific Gravity, Urine: 1.024 (ref 1.005–1.030)
pH: 6 (ref 5.0–8.0)

## 2024-07-28 LAB — FETAL FIBRONECTIN: Fetal Fibronectin: NEGATIVE

## 2024-07-28 NOTE — MAU Provider Note (Addendum)
 History     CSN: [id number]  Arrival date and time: 07/28/24 1710   Event Date/Time   First Provider Initiated Contact with Patient 07/28/2024  5:28 PM   Chief Complaint  Patient presents with   Vaginal Discharge   Pelvic Pressure    HPI  Chelsea Herman is a 18 y.o. H5E7987 at [redacted]w[redacted]d with pregnancy complicated by velamentous cord insertion who presents to the MAU for passed mucus plug and increased pelvic pressure. Patient reports significant mucus light green in color when wiping this AM. Patient states mucus looked similar to mucus plug with her previous pregnancy. This was followed by 2 contractions ~12pm. Mucus was later white in color. Patient also reports increased pelvic pressure with pain when sitting up right after initially noticing the mucus. Patient has been experiencing pelvic pressure throughout this pregnancy but states it has never been painful until today. She is worried about baby dropping further into her pelvis due to velamentous cord insertion. Cervical check on 7/21 with cervix closed and thick. Patient endorses cramping and low back pain, denies vaginal bleeding, pain, vaginal odor or vaginal itching. Good fetal movement. Patient has 2 active toddlers at home, no recent sexual intercourse.  Patient was recently seen in the MAU on 7/26 for DFM with reassuring NST. She was also seen at [facility] on 7/21 for pelvic pressure with cervical check as described above.  Past Medical History:  Diagnosis Date   Asthma    as a child-no current medications or treatments   Diabetes mellitus without complication (HCC)    Gestational diabetes    History of gestational hypertension 02/04/2021   Hypertension    Migraines     Past Surgical History:  Procedure Laterality Date   NO PAST SURGERIES      Family History  Problem Relation Age of Onset   Hearing loss Mother    Asthma Neg Hx    Cancer Neg Hx    Diabetes Neg Hx    Stroke Neg Hx     Social History   Tobacco  Use   Smoking status: Never    Passive exposure: Never   Smokeless tobacco: Never  Vaping Use   Vaping status: Never Used  Substance Use Topics   Alcohol use: Never   Drug use: Never    Allergies:  Allergies  Allergen Reactions   Peanut-Containing Drug Products Anaphylaxis   Citric Acid Rash    Medications Prior to Admission  Medication Sig Dispense Refill Last Dose/Taking   acetaminophen  (TYLENOL ) 500 MG tablet Take 2 tablets (1,000 mg total) by mouth every 6 (six) hours as needed for mild pain (pain score 1-3). 60 tablet 2 Unknown   aspirin  EC 81 MG tablet Take 1 tablet (81 mg total) by mouth daily. Swallow whole. 30 tablet 12 Unknown   Blood Pressure Monitoring (BLOOD PRESSURE KIT) DEVI 1 Device by Does not apply route as needed. 1 each 0 Unknown   cyclobenzaprine  (FLEXERIL ) 10 MG tablet Take 1 tablet (10 mg total) by mouth 2 (two) times daily as needed for muscle spasms. 20 tablet 1 Unknown   ondansetron  (ZOFRAN -ODT) 4 MG disintegrating tablet Take 1 tablet (4 mg total) by mouth every 6 (six) hours as needed for nausea or vomiting. 20 tablet 1 Unknown   Prenatal Vit-Fe Fumarate-FA (PREPLUS) 27-1 MG TABS Take 1 tablet by mouth daily. 30 tablet 13 Unknown    ROS reviewed and pertinent positives and negatives as documented in HPI.  Physical Exam   Blood  pressure 100/63, pulse (!) 112, temperature 98.5 F (36.9 C), temperature source Oral, resp. rate 20, height 5' 5 (1.651 m), weight 88.1 kg, last menstrual period 12/21/2023, SpO2 98%, unknown if currently breastfeeding.  Physical Exam  General: Alert, well-appearing female, NAD, pleasant HEENT: Normocephalic, atraumatic, EOM grossly intact Respiratory: Breathing comfortably on room air Abdomen: Soft, gravid, appropriate for gestational age Extremities: Moves all four extremities appropriately Neuro: Alert, no obvious focal deficits Skin: No lesions/rashes visualized  MAU Course  Procedures  MDM 17 y.o. H5E7987 at  [redacted]w[redacted]d presenting for passed mucus plug and increased pelvic pressure. Obtained fetal fibronectin followed by cervical check to assess for pre-term labor. Patient is ~1cm dilated with negative fetal fibronectin. Low concerns for pre-term labor at this point. Patient has a routine OB visit scheduled for 8/7. Encouraged keeping the appointment and discussed return precautions. Also encouraged increased hydration as this may also contribute to the patient's symptoms.  Assessment and Plan     ICD-10-CM   1. [redacted] weeks gestation of pregnancy  Z3A.33     2. Pelvic pressure in pregnancy, third trimester  O26.893    R10.2     3. No leakage of amniotic fluid into vagina  Z03.71     - Follow up with OB for routine visit - Drink at least 64 oz water  per day  Darren Jernigan, DO 07/28/2024, 7:24 PM     Attestation of Supervision of Student:  I confirm that I have verified the information documented in the resident's note and that I have also personally reperformed the history, physical exam and all medical decision making activities.  I have verified that all services and findings are accurately documented in this student's note; and I agree with management and plan as outlined in the documentation. I have also made any necessary editorial changes.   Sharalyn Lomba, CNM Center for [employer], [facility] 07/28/2024 7:55 PM

## 2024-07-28 NOTE — Discharge Instructions (Signed)
 Please keep your appointment with your OB/Gyn on 8/7 and increase your fluid intake to at least 64oz per day.

## 2024-07-28 NOTE — MAU Note (Signed)
 Chelsea Herman is a 18 y.o. at [redacted]w[redacted]d here in MAU reporting: she had green colored mucus discharge when wiping on several occasions.  Denies vaginal odor and itching. Denies VB or LOF.  States she's had two ctxs and feels a lot of pelvic pressure.  States she's currently wearing support band.  Reports +FM.  LMP: 12/20/2024 Onset of complaint: today  Pain score: 0 Vitals:   07/28/24 1729  BP: 120/70  Pulse: 100  Resp: 20  Temp: 98.5 F (36.9 C)  SpO2: 98%     FHT: deferred secondary maternal apparel, wearing long dress.  EFM will applied once roomed  Lab orders placed from triage: UA

## 2024-08-01 ENCOUNTER — Other Ambulatory Visit: Payer: Self-pay

## 2024-08-01 ENCOUNTER — Ambulatory Visit: Attending: Obstetrics and Gynecology | Admitting: Maternal & Fetal Medicine

## 2024-08-01 ENCOUNTER — Ambulatory Visit

## 2024-08-01 VITALS — BP 115/72 | HR 96

## 2024-08-01 DIAGNOSIS — E669 Obesity, unspecified: Secondary | ICD-10-CM

## 2024-08-01 DIAGNOSIS — Z3A32 32 weeks gestation of pregnancy: Secondary | ICD-10-CM

## 2024-08-01 DIAGNOSIS — Z362 Encounter for other antenatal screening follow-up: Secondary | ICD-10-CM | POA: Insufficient documentation

## 2024-08-01 DIAGNOSIS — O0990 Supervision of high risk pregnancy, unspecified, unspecified trimester: Secondary | ICD-10-CM

## 2024-08-01 DIAGNOSIS — Z8759 Personal history of other complications of pregnancy, childbirth and the puerperium: Secondary | ICD-10-CM

## 2024-08-01 DIAGNOSIS — Z8632 Personal history of gestational diabetes: Secondary | ICD-10-CM

## 2024-08-01 DIAGNOSIS — O209 Hemorrhage in early pregnancy, unspecified: Secondary | ICD-10-CM

## 2024-08-01 DIAGNOSIS — O99213 Obesity complicating pregnancy, third trimester: Secondary | ICD-10-CM

## 2024-08-01 DIAGNOSIS — O09293 Supervision of pregnancy with other poor reproductive or obstetric history, third trimester: Secondary | ICD-10-CM | POA: Diagnosis not present

## 2024-08-01 DIAGNOSIS — O43123 Velamentous insertion of umbilical cord, third trimester: Secondary | ICD-10-CM | POA: Insufficient documentation

## 2024-08-01 DIAGNOSIS — J45909 Unspecified asthma, uncomplicated: Secondary | ICD-10-CM

## 2024-08-01 NOTE — Progress Notes (Signed)
 After review, MFM consult with provider is not indicated for today  William Glenn, DO 08/01/2024 2:27 PM  [facility]

## 2024-08-05 ENCOUNTER — Encounter: Payer: Self-pay | Admitting: Advanced Practice Midwife

## 2024-08-05 NOTE — Progress Notes (Deleted)
       PRENATAL VISIT NOTE- [facility] Pregnancy Cycle {NUMBERS:20191}, Session # {NUMBERS:20191}  Subjective:  Chelsea Herman is a 18 y.o. H5E7987 at [redacted]w[redacted]d being seen today for ongoing prenatal care through [facility] Pregnancy.  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  Patient reports {sx:14538}.   .  .   . ***Denies leaking of fluid/ROM.   The following portions of the patient's history were reviewed and updated as appropriate: allergies, current medications, past family history, past medical history, past social history, past surgical history and problem list. Problem list updated.  Objective:  There were no vitals filed for this visit.  Fetal Status:           General:  Alert, oriented and cooperative. Patient is in no acute distress.  Skin: Skin is warm and dry. No rash noted.   Cardiovascular: Normal heart rate noted  Respiratory: Normal respiratory effort, no problems with respiration noted  Abdomen: Soft, gravid, appropriate for gestational age.        Pelvic: {Blank single:19197::Cervical exam performed,Cervical exam deferred}        Extremities: Normal range of motion.     Mental Status: Normal mood and affect. Normal behavior. Normal judgment and thought content.   Assessment and Plan:  Pregnancy: H5E7987 at [redacted]w[redacted]d  1. Supervision of high risk pregnancy, antepartum (Primary) ***  Centering Pregnancy, Session#7: Reviewed resources in [employer].   Facilitated discussion today:  newborn safety, breastfeeding and  postpartum mood changes Mindfulness activity with mindful listening  Fundal height and FHR appropriate today unless noted otherwise in plan. Patient to continue group care.   2. 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 Pelvic pressure in pregnancy, antepartum, third trimester ***  4. [redacted] weeks gestation of pregnancy ***   {Blank single:19197::Term,Preterm} labor symptoms and general obstetric precautions including but not limited to vaginal bleeding, contractions, leaking of fluid and fetal movement were reviewed in detail with the patient. Please refer to After Visit Summary for other counseling recommendations.  No follow-ups on file.  Future Appointments  Date Time Provider Department Center  08/05/2024  9:00 AM Milly Olam DELENA EDDY [facility]  08/19/2024  9:00 AM CENTERING PROVIDER [facility]  08/29/2024  1:00 PM [facility] PROVIDER 1 [facility]  08/29/2024  1:30 PM [facility] US1 [facility]  09/02/2024  9:00 AM CENTERING PROVIDER [facility]  09/05/2024  2:15 PM [facility] NST [facility]  09/12/2024  1:15 PM [facility] NST [facility]  09/16/2024  9:00 AM CENTERING PROVIDER [facility]  09/19/2024  1:15 PM [facility] NST [facility]    Olam Milly, CNM

## 2024-08-11 ENCOUNTER — Encounter (HOSPITAL_COMMUNITY): Payer: Self-pay | Admitting: Obstetrics and Gynecology

## 2024-08-11 ENCOUNTER — Inpatient Hospital Stay (HOSPITAL_COMMUNITY)
Admission: AD | Admit: 2024-08-11 | Discharge: 2024-08-11 | Disposition: A | Discharge status: Home / Self Care | Attending: Obstetrics and Gynecology | Admitting: Obstetrics and Gynecology

## 2024-08-11 DIAGNOSIS — O2653 Maternal hypotension syndrome, third trimester: Secondary | ICD-10-CM | POA: Diagnosis not present

## 2024-08-11 DIAGNOSIS — M899 Disorder of bone, unspecified: Secondary | ICD-10-CM

## 2024-08-11 DIAGNOSIS — Z3A33 33 weeks gestation of pregnancy: Secondary | ICD-10-CM | POA: Diagnosis not present

## 2024-08-11 DIAGNOSIS — R102 Pelvic and perineal pain: Secondary | ICD-10-CM

## 2024-08-11 DIAGNOSIS — R000 Tachycardia, unspecified: Secondary | ICD-10-CM | POA: Diagnosis not present

## 2024-08-11 DIAGNOSIS — R079 Chest pain, unspecified: Secondary | ICD-10-CM | POA: Insufficient documentation

## 2024-08-11 DIAGNOSIS — O26893 Other specified pregnancy related conditions, third trimester: Secondary | ICD-10-CM

## 2024-08-11 DIAGNOSIS — O26853 Spotting complicating pregnancy, third trimester: Secondary | ICD-10-CM | POA: Insufficient documentation

## 2024-08-11 LAB — URINALYSIS, ROUTINE W REFLEX MICROSCOPIC
Bilirubin Urine: NEGATIVE
Glucose, UA: NEGATIVE mg/dL
Hgb urine dipstick: NEGATIVE
Ketones, ur: 20 mg/dL — AB
Nitrite: NEGATIVE
Protein, ur: 30 mg/dL — AB
Specific Gravity, Urine: 1.023 (ref 1.005–1.030)
pH: 6 (ref 5.0–8.0)

## 2024-08-11 LAB — WET PREP, GENITAL
Clue Cells Wet Prep HPF POC: NONE SEEN
Sperm: NONE SEEN
Trich, Wet Prep: NONE SEEN
WBC, Wet Prep HPF POC: >=10 — AB (ref <10)
Yeast Wet Prep HPF POC: NONE SEEN

## 2024-08-11 LAB — CBC
HCT: 31.8 % — ABNORMAL LOW (ref 36.0–46.0)
Hemoglobin: 10.4 g/dL — ABNORMAL LOW (ref 12.0–15.0)
MCH: 26.5 pg (ref 26.0–34.0)
MCHC: 32.7 g/dL (ref 30.0–36.0)
MCV: 80.9 fL (ref 80.0–100.0)
Platelets: 227 K/uL (ref 150–400)
RBC: 3.93 MIL/uL (ref 3.87–5.11)
RDW: 12.7 % (ref 11.5–15.5)
WBC: 5.9 K/uL (ref 4.0–10.5)
nRBC: 0 % (ref 0.0–0.2)

## 2024-08-11 LAB — COMPREHENSIVE METABOLIC PANEL WITH GFR
ALT: 10 U/L (ref 0–44)
AST: 25 U/L (ref 15–41)
Albumin: 3 g/dL — ABNORMAL LOW (ref 3.5–5.0)
Alkaline Phosphatase: 111 U/L (ref 38–126)
Anion gap: 13 (ref 5–15)
BUN: <5 mg/dL — ABNORMAL LOW (ref 6–20)
CO2: 18 mmol/L — ABNORMAL LOW (ref 22–32)
Calcium: 8.8 mg/dL — ABNORMAL LOW (ref 8.9–10.3)
Chloride: 103 mmol/L (ref 98–111)
Creatinine, Ser: 0.74 mg/dL (ref 0.44–1.00)
GFR, Estimated: >60 mL/min (ref >60)
Glucose, Bld: 142 mg/dL — ABNORMAL HIGH (ref 70–99)
Potassium: 3.3 mmol/L — ABNORMAL LOW (ref 3.5–5.1)
Sodium: 134 mmol/L — ABNORMAL LOW (ref 135–145)
Total Bilirubin: 0.3 mg/dL (ref 0.0–1.2)
Total Protein: 6.3 g/dL — ABNORMAL LOW (ref 6.5–8.1)

## 2024-08-11 MED ORDER — ACETAMINOPHEN 500 MG PO TABS
1000.0000 mg | ORAL_TABLET | Freq: Once | ORAL | Status: AC
  Administered 2024-08-11: 1000 mg via ORAL
  Filled 2024-08-11: qty 2

## 2024-08-11 MED ORDER — LACTATED RINGERS IV BOLUS
1000.0000 mL | Freq: Once | INTRAVENOUS | Status: AC
Start: 2024-08-11 — End: 2024-08-11
  Administered 2024-08-11: 1000 mL via INTRAVENOUS

## 2024-08-11 MED ORDER — BACLOFEN 20 MG PO TABS
20.0000 mg | ORAL_TABLET | Freq: Once | ORAL | Status: AC
  Administered 2024-08-11: 20 mg via ORAL
  Filled 2024-08-11 (×2): qty 1

## 2024-08-11 MED ORDER — BACLOFEN 20 MG PO TABS
20.0000 mg | ORAL_TABLET | Freq: Two times a day (BID) | ORAL | 0 refills | Status: AC | PRN
Start: 2024-08-11 — End: 2024-08-21

## 2024-08-11 NOTE — MAU Note (Incomplete)
..  Chelsea Herman is a 18 y.o. at [redacted]w[redacted]d here in MAU reporting: increased pelvic and vaginal pressure this morning. Her vaginal discharge has a pinkish color. She has tried taking flexeril  but it hasn't helped.   Pain score: *** There were no vitals filed for this visit.   FHT:*** Lab orders placed from triage:

## 2024-08-11 NOTE — MAU Provider Note (Signed)
 Chief Complaint:  Pelvic Pain (pressure)   HPI    Chelsea Herman is a 18 y.o. H5E7987 at [redacted]w[redacted]d who presents to maternity admissions reporting increased pelvic pressure.  Patient has had complications with pelvic pressure and vaginal discharge throughout the pregnancy.  Today she reports that it has increased in duration.  She also reports some scant vaginal spotting.  Patient was last seen in MAU 07/28/2024 for similar complaints of pelvic pressure and vaginal discharge FFN at that time was negative and cervical check was approximately 1 cm dilated.  Pregnancy has  been complicated by the  velamentous cord insertion and has been followed by MFM, (last seen 08/01/2024) She also has a history of tachycardia and shortness of breath this pregnancy and has been referred to [facility]. Sheena [facility] cardiology) which patient says she has not gone to her appointment yet  Pregnancy Course: [facility]   Past Medical History:  Diagnosis Date   Asthma    as a child-no current medications or treatments   Diabetes mellitus without complication (HCC)    Gestational diabetes    History of gestational hypertension 02/04/2021   Hypertension    Migraines    OB History  Gravida Para Term Preterm AB Living  4 2 2  0 1 2  SAB IAB Ectopic Multiple Live Births  1 0 0 0 2    # Outcome Date GA Lbr Len/2nd Weight Sex Type Anes PTL Lv  4 Current           3 SAB 11/13/23 [redacted]w[redacted]d         2 Term 01/02/23 [redacted]w[redacted]d 01:00 3600 g M Vag-Spont None  LIV     Birth Comments: GDM  1 Term 02/04/21 [redacted]w[redacted]d 03:34 / 00:31 3464 g F Vag-Spont EPI  LIV     Birth Comments: GHTN   Past Surgical History:  Procedure Laterality Date   NO PAST SURGERIES     Family History  Problem Relation Age of Onset   Hearing loss Mother    Asthma Neg Hx    Cancer Neg Hx    Diabetes Neg Hx    Stroke Neg Hx    Social History   Tobacco Use   Smoking status: Never    Passive exposure: Never   Smokeless tobacco: Never  Vaping Use   Vaping status: Never Used   Substance Use Topics   Alcohol use: Never   Drug use: Never   Allergies  Allergen Reactions   Peanut-Containing Drug Products Anaphylaxis   Citric Acid Rash   Medications Prior to Admission  Medication Sig Dispense Refill Last Dose/Taking   cyclobenzaprine  (FLEXERIL ) 10 MG tablet Take 1 tablet (10 mg total) by mouth 2 (two) times daily as needed for muscle spasms. 20 tablet 1 Past Week   acetaminophen  (TYLENOL ) 500 MG tablet Take 2 tablets (1,000 mg total) by mouth every 6 (six) hours as needed for mild pain (pain score 1-3). 60 tablet 2 Unknown   aspirin  EC 81 MG tablet Take 1 tablet (81 mg total) by mouth daily. Swallow whole. 30 tablet 12    Blood Pressure Monitoring (BLOOD PRESSURE KIT) DEVI 1 Device by Does not apply route as needed. 1 each 0    ondansetron  (ZOFRAN -ODT) 4 MG disintegrating tablet Take 1 tablet (4 mg total) by mouth every 6 (six) hours as needed for nausea or vomiting. 20 tablet 1    Prenatal Vit-Fe Fumarate-FA (PREPLUS) 27-1 MG TABS Take 1 tablet by mouth daily. 30 tablet 13  I have reviewed patient's Past Medical Hx, Surgical Hx, Family Hx, Social Hx, medications and allergies.   ROS  Pertinent items noted in HPI and remainder of comprehensive ROS otherwise negative.   PHYSICAL EXAM  Patient Vitals for the past 24 hrs:  BP Temp Temp src Pulse Resp SpO2  08/11/24 2050 117/73 -- -- 85 -- --  08/11/24 2030 104/66 -- -- 94 -- 100 %  08/11/24 2020 -- -- -- -- -- 100 %  08/11/24 2011 (!) 89/50 98.1 F (36.7 C) Oral (!) 146 (!) 28 100 %  08/11/24 2010 (!) 64/45 -- -- (!) 150 -- 100 %    Constitutional: Well-developed, well-nourished female in no acute distress.  Cardiovascular: normal rate & rhythm, warm and well-perfused Respiratory: normal effort, no problems with respiration noted GI: Abd soft, non-tender, gravid MS: Extremities nontender, no edema, normal ROM Neurologic: Alert and oriented x 4.  GU: no CVA tenderness Pelvic: Patient with super pubic  pain and pressure/pain on palpation  over the pubic symphysis  Speculum exam chaperoned by Maurilio Hollie PEAK   No Pooling, physiological discharge present, no evidence of blood and cervix visually closed   Fetal Tracing: Cat 1 reactive Baseline: 145-150 Variability:moderate Accelerations: present Decelerations:absent Toco: no ctx's   Labs: Results for orders placed or performed during the hospital encounter of 08/11/24 (from the past 24 hours)  Wet prep, genital     Status: Abnormal   Collection Time: 08/11/24  8:22 PM   Specimen: Urine, Clean Catch  Result Value Ref Range   Yeast Wet Prep HPF POC NONE SEEN NONE SEEN   Trich, Wet Prep NONE SEEN NONE SEEN   Clue Cells Wet Prep HPF POC NONE SEEN NONE SEEN   WBC, Wet Prep HPF POC >=10 (A) <10   Sperm NONE SEEN   CBC     Status: Abnormal   Collection Time: 08/11/24  8:25 PM  Result Value Ref Range   WBC 5.9 4.0 - 10.5 K/uL   RBC 3.93 3.87 - 5.11 MIL/uL   Hemoglobin 10.4 (L) 12.0 - 15.0 g/dL   HCT 68.1 (L) 63.9 - 53.9 %   MCV 80.9 80.0 - 100.0 fL   MCH 26.5 26.0 - 34.0 pg   MCHC 32.7 30.0 - 36.0 g/dL   RDW 87.2 88.4 - 84.4 %   Platelets 227 150 - 400 K/uL   nRBC 0.0 0.0 - 0.2 %  Comprehensive metabolic panel     Status: Abnormal   Collection Time: 08/11/24  8:25 PM  Result Value Ref Range   Sodium 134 (L) 135 - 145 mmol/L   Potassium 3.3 (L) 3.5 - 5.1 mmol/L   Chloride 103 98 - 111 mmol/L   CO2 18 (L) 22 - 32 mmol/L   Glucose, Bld 142 (H) 70 - 99 mg/dL   BUN <5 (L) 6 - 20 mg/dL   Creatinine, Ser 9.25 0.44 - 1.00 mg/dL   Calcium 8.8 (L) 8.9 - 10.3 mg/dL   Total Protein 6.3 (L) 6.5 - 8.1 g/dL   Albumin 3.0 (L) 3.5 - 5.0 g/dL   AST 25 15 - 41 U/L   ALT 10 0 - 44 U/L   Alkaline Phosphatase 111 38 - 126 U/L   Total Bilirubin 0.3 0.0 - 1.2 mg/dL   GFR, Estimated >39 >39 mL/min   Anion gap 13 5 - 15  Urinalysis, Routine w reflex microscopic -Urine, Clean Catch     Status: Abnormal   Collection Time: 08/11/24  8:30 PM  Result  Value Ref Range   Color, Urine YELLOW YELLOW   APPearance CLOUDY (A) CLEAR   Specific Gravity, Urine 1.023 1.005 - 1.030   pH 6.0 5.0 - 8.0   Glucose, UA NEGATIVE NEGATIVE mg/dL   Hgb urine dipstick NEGATIVE NEGATIVE   Bilirubin Urine NEGATIVE NEGATIVE   Ketones, ur 20 (A) NEGATIVE mg/dL   Protein, ur 30 (A) NEGATIVE mg/dL   Nitrite NEGATIVE NEGATIVE   Leukocytes,Ua TRACE (A) NEGATIVE   RBC / HPF 0-5 0 - 5 RBC/hpf   WBC, UA 0-5 0 - 5 WBC/hpf   Bacteria, UA RARE (A) NONE SEEN   Squamous Epithelial / HPF 21-50 0 - 5 /HPF   Mucus PRESENT     Imaging:  No results found.  MDM & MAU COURSE  MDM:  HIGH  Prenatal chart reviewed Physical exam performed with pelvic Patient presented with hypotension and tachycardia therefore EKG was ordered EKG sinus tachycardia otherwise normal IV fluid bolus was started Vaginal cultures: neg wet prep UA: unremarkable CBC, CMP : Unremarkable NST for gestational age and fetal reassurance ( Cat 1 reactive ) Tylenol /baclofen  ordered for pain relief likely pubic symphysis pain ( Relief noted - will plan for RX at discharge) Ice compress applied  Relief noted plan for discharge at this time with RX for Baclofen  and continue Ice Compresses prn for pain  MAU Course: Orders Placed This Encounter  Procedures   Wet prep, genital   Urinalysis, Routine w reflex microscopic -Urine, Clean Catch   CBC   Comprehensive metabolic panel   ED EKG   Discharge patient Discharge disposition: 01-Home or Self Care; Discharge patient date: 08/11/2024   Meds ordered this encounter  Medications   lactated ringers  bolus 1,000 mL   acetaminophen  (TYLENOL ) tablet 1,000 mg   baclofen  (LIORESAL ) tablet 20 mg   baclofen  (LIORESAL ) 20 MG tablet    Sig: Take 1 tablet (20 mg total) by mouth 2 (two) times daily as needed for up to 10 days (Pubic symphysis pain).    Dispense:  20 tablet    Refill:  0    Supervising Provider:   PRATT, TANYA S [[id number]]    I have reviewed  the patient chart and performed the physical exam . I have ordered & interpreted the lab results and reviewed and interpreted the NST Medications ordered as stated below.  A/P as described below.  Counseling and education provided and patient agreeable  with plan as described below. Verbalized understanding.    ASSESSMENT   1. Pubic bone pain   2. [redacted] weeks gestation of pregnancy   3. Chronic tachycardia   4. Maternal hypotension syndrome in third trimester   5. Pelvic pain affecting pregnancy in third trimester, antepartum      PLAN  Discharge home in stable condition with return precautions.   Follow up with primary OB   See AVS for full description of information given to the patient including both verbal and written. Patient verbalized understanding and agrees with the plan as described above.     Allergies as of 08/11/2024       Reactions   Peanut-containing Drug Products Anaphylaxis   Citric Acid Rash        Medication List     TAKE these medications    acetaminophen  500 MG tablet Commonly known as: TYLENOL  Take 2 tablets (1,000 mg total) by mouth every 6 (six) hours as needed for mild pain (pain score 1-3).   aspirin  EC 81 MG tablet  Take 1 tablet (81 mg total) by mouth daily. Swallow whole.   baclofen  20 MG tablet Commonly known as: LIORESAL  Take 1 tablet (20 mg total) by mouth 2 (two) times daily as needed for up to 10 days (Pubic symphysis pain).   Blood Pressure Kit Devi 1 Device by Does not apply route as needed.   cyclobenzaprine  10 MG tablet Commonly known as: FLEXERIL  Take 1 tablet (10 mg total) by mouth 2 (two) times daily as needed for muscle spasms.   ondansetron  4 MG disintegrating tablet Commonly known as: ZOFRAN -ODT Take 1 tablet (4 mg total) by mouth every 6 (six) hours as needed for nausea or vomiting.   PrePLUS 27-1 MG Tabs Take 1 tablet by mouth daily.        Olam Dalton, MSN, [facility], Center for [employer]

## 2024-08-12 LAB — GC/CHLAMYDIA PROBE AMP (~~LOC~~) NOT AT ARMC
Chlamydia: NEGATIVE
Comment: NEGATIVE
Comment: NORMAL
Neisseria Gonorrhea: NEGATIVE

## 2024-08-17 ENCOUNTER — Other Ambulatory Visit: Payer: Self-pay | Admitting: Obstetrics and Gynecology

## 2024-08-17 ENCOUNTER — Telehealth: Payer: Self-pay | Admitting: Obstetrics and Gynecology

## 2024-08-17 ENCOUNTER — Inpatient Hospital Stay (HOSPITAL_COMMUNITY)
Admission: AD | Admit: 2024-08-17 | Discharge: 2024-08-17 | Disposition: A | Discharge status: Home / Self Care | Attending: Obstetrics and Gynecology | Admitting: Obstetrics and Gynecology

## 2024-08-17 ENCOUNTER — Encounter (HOSPITAL_COMMUNITY): Payer: Self-pay | Admitting: Obstetrics and Gynecology

## 2024-08-17 DIAGNOSIS — Z3689 Encounter for other specified antenatal screening: Secondary | ICD-10-CM | POA: Diagnosis not present

## 2024-08-17 DIAGNOSIS — K047 Periapical abscess without sinus: Secondary | ICD-10-CM

## 2024-08-17 DIAGNOSIS — O26893 Other specified pregnancy related conditions, third trimester: Secondary | ICD-10-CM

## 2024-08-17 DIAGNOSIS — Z3A34 34 weeks gestation of pregnancy: Secondary | ICD-10-CM | POA: Diagnosis not present

## 2024-08-17 LAB — WET PREP, GENITAL
Clue Cells Wet Prep HPF POC: NONE SEEN
Sperm: NONE SEEN
Trich, Wet Prep: NONE SEEN
WBC, Wet Prep HPF POC: <10 (ref <10)
Yeast Wet Prep HPF POC: NONE SEEN

## 2024-08-17 MED ORDER — OXYCODONE HCL 5 MG PO TABS: 5.0000 mg | ORAL_TABLET | Freq: Four times a day (QID) | ORAL | 0 refills | Status: DC | PRN

## 2024-08-17 MED ORDER — MORPHINE SULFATE (PF) 4 MG/ML IV SOLN
2.0000 mg | Freq: Once | INTRAVENOUS | Status: AC
  Administered 2024-08-17: 2 mg via INTRAMUSCULAR

## 2024-08-17 MED ORDER — LIDOCAINE HCL (PF) 1 % IJ SOLN
5.0000 mL | Freq: Once | INTRAMUSCULAR | Status: AC
  Administered 2024-08-17: 5 mL

## 2024-08-17 MED ORDER — PREDNISONE 5 MG PO TABS: 5.0000 mg | ORAL_TABLET | Freq: Every day | ORAL | 0 refills | Status: DC

## 2024-08-17 MED ORDER — MORPHINE SULFATE (PF) 4 MG/ML IV SOLN
2.0000 mg | Freq: Once | INTRAVENOUS | Status: DC
  Filled 2024-08-17: qty 1

## 2024-08-17 MED ORDER — OXYCODONE-ACETAMINOPHEN 5-325 MG PO TABS: 1.0000 | ORAL_TABLET | ORAL | 0 refills | Status: DC | PRN

## 2024-08-17 MED ORDER — CEFTRIAXONE SODIUM 1 G IJ SOLR
1.0000 g | Freq: Once | INTRAMUSCULAR | Status: AC
  Administered 2024-08-17: 1 g via INTRAMUSCULAR
  Filled 2024-08-17: qty 10

## 2024-08-17 NOTE — Telephone Encounter (Signed)
 Chelsea Herman was seen in MAU this morning with tooth pain.She was given an Rx for Roxicodone  and was told by the pharmacy that this medication needed a prior authorization. She is requesting an RX for a different medication as she cannot wait until Monday for the prior authorization.  Rx: Percocet sent. #15 no refills  Sheldon Sem, Delon FERNS, NP 08/17/2024 7:39 PM

## 2024-08-17 NOTE — MAU Provider Note (Signed)
 Event Date/Time   First Provider Initiated Contact with Patient 08/17/24 0346     S [name] is a 18 y.o. [phone] pregnant female at [redacted]w[redacted]d who presents to MAU today with complaint of intense tooth pain and some cramping but no vaginal bleeding. Tooth has been broken for about a year, pain started a few days ago but got intensely worse today and now the side of her face is swollen. Thinks the stress and pain of the tooth is making her cramp, declines cervical exam. No other physical complaints.   Receives care at [facility]. Prenatal records reviewed.  Pertinent items noted in HPI and remainder of comprehensive ROS otherwise negative.   O BP 115/70   Pulse 84   Temp 98.2 F (36.8 C) (Oral)   Resp 16   Ht 5' 5 (1.651 m)   Wt 195 lb 6.4 oz (88.6 kg)   LMP 12/21/2023   SpO2 100%   BMI 32.52 kg/m  Physical Exam Vitals and nursing note reviewed.  Constitutional:      General: She is in acute distress (pain).     Appearance: She is well-developed and normal weight.  HENT:     Head:     Jaw: Tenderness present.      Mouth/Throat:     Mouth: Mucous membranes are moist.  Eyes:     Pupils: Pupils are equal, round, and reactive to light.  Cardiovascular:     Rate and Rhythm: Normal rate and regular rhythm.  Pulmonary:     Effort: Pulmonary effort is normal.  Musculoskeletal:        General: Normal range of motion.  Skin:    General: Skin is warm and dry.     Capillary Refill: Capillary refill takes less than 2 seconds.  Neurological:     Mental Status: She is alert and oriented to person, place, and time.  Psychiatric:        Mood and Affect: Mood normal.        Behavior: Behavior normal.    No results found for this or any previous visit (from the past 24 hours).  Fetal Tracing: reactive Baseline: 135 Variability: moderate Accelerations: 15x15 Decelerations: none Toco: UI  MDM: Moderate MAU Course: IM rocephin  and morphine  ordered.  Pt declined  cervical exam.  Dental letter uploaded to MyChart. Pain relieved to a 4/10 and receding. Meds sent to pharmacy.  A 1. Infected tooth (Primary)  2. NST (non-stress test) reactive  3. [redacted] weeks gestation of pregnancy   P Discharge from MAU in stable condition with return precautions Follow up at [facility] as scheduled for ongoing prenatal care Advised to see emergency dentist as quickly as possible  Future Appointments  Date Time Provider Department Center  08/19/2024  9:00 AM CENTERING PROVIDER [facility]  08/29/2024  1:00 PM [facility] PROVIDER 1 [facility]  08/29/2024  1:30 PM [facility] US1 [facility]  09/02/2024  9:00 AM CENTERING PROVIDER [facility]  09/05/2024  2:15 PM [facility] NST [facility]  09/12/2024  1:15 PM [facility] NST [facility]  09/16/2024  9:15 AM [place], [place] DELENA HOWARD [facility]  09/19/2024  1:15 PM [facility] NST [facility]   Allergies as of 08/17/2024       Reactions   Peanut-containing Drug Products Anaphylaxis   Citric Acid Rash        Medication List     TAKE these medications    acetaminophen  500 MG tablet Commonly known as: TYLENOL  Take 2 tablets (  1,000 mg total) by mouth every 6 (six) hours as needed for mild pain (pain score 1-3).   aspirin  EC 81 MG tablet Take 1 tablet (81 mg total) by mouth daily. Swallow whole.   baclofen  20 MG tablet Commonly known as: LIORESAL  Take 1 tablet (20 mg total) by mouth 2 (two) times daily as needed for up to 10 days (Pubic symphysis pain).   Blood Pressure Kit Devi 1 Device by Does not apply route as needed.   cyclobenzaprine  10 MG tablet Commonly known as: FLEXERIL  Take 1 tablet (10 mg total) by mouth 2 (two) times daily as needed for muscle spasms.   ondansetron  4 MG disintegrating tablet Commonly known as: ZOFRAN -ODT Take 1 tablet (4 mg total) by mouth every 6 (six) hours as needed for nausea or vomiting.   oxyCODONE  5 MG immediate release tablet Commonly known as: Roxicodone  Take 1-2 tablets (5-10 mg  total) by mouth every 6 (six) hours as needed for severe pain (pain score 7-10).   predniSONE  5 MG tablet Commonly known as: DELTASONE  Take 1 tablet (5 mg total) by mouth daily with breakfast.   PrePLUS 27-1 MG Tabs Take 1 tablet by mouth daily.       Vannie Cornell [place], [place] 08/17/2024 3:47 AM

## 2024-08-17 NOTE — MAU Note (Signed)
 Pt says her right back molar broke- 1 year ago - and at 0148 -starting hurt really bad.  She XS Tyl at 11am- 1 tab  PNC- clinic  Mild Lower cramps started 0800 Now- 4/10 Last sex- 8 mths ago

## 2024-08-19 ENCOUNTER — Encounter: Payer: Self-pay | Admitting: Advanced Practice Midwife

## 2024-08-19 LAB — GC/CHLAMYDIA PROBE AMP (~~LOC~~) NOT AT ARMC
Chlamydia: NEGATIVE
Comment: NEGATIVE
Comment: NORMAL
Neisseria Gonorrhea: NEGATIVE

## 2024-08-26 ENCOUNTER — Inpatient Hospital Stay (HOSPITAL_COMMUNITY)
Admission: AD | Admit: 2024-08-26 | Discharge: 2024-08-26 | Disposition: A | Discharge status: Home / Self Care | Payer: Self-pay | Attending: Obstetrics & Gynecology | Admitting: Obstetrics & Gynecology

## 2024-08-26 DIAGNOSIS — O26893 Other specified pregnancy related conditions, third trimester: Secondary | ICD-10-CM

## 2024-08-26 DIAGNOSIS — K047 Periapical abscess without sinus: Secondary | ICD-10-CM | POA: Diagnosis not present

## 2024-08-26 DIAGNOSIS — K0889 Other specified disorders of teeth and supporting structures: Secondary | ICD-10-CM

## 2024-08-26 DIAGNOSIS — Z3A35 35 weeks gestation of pregnancy: Secondary | ICD-10-CM

## 2024-08-26 MED ORDER — AMOXICILLIN-POT CLAVULANATE 875-125 MG PO TABS: 1.0000 | ORAL_TABLET | Freq: Two times a day (BID) | ORAL | 0 refills | Status: DC

## 2024-08-26 NOTE — MAU Note (Signed)
 Allyne Lad is a 18 y.o. at [redacted]w[redacted]d here in MAU reporting: intense tooth pain . Was seen by MAU last week for same complaint. Was given pain meds but they are no longer working. Stated she went to the dentist but thye would not see her because she di not have a letter to clear here from her Ob. Pt stated she is ready to pull the tooth ou herself.  Took oxycodine this morning., without out much relief. Reports some cramping and 2 braxton hicks crx oday. Good fetal movement reported. Denies any vag bleeding or leaking.   LMP:  Onset of complaint: 2 weeks Pain score: 10 Vitals:   08/26/24 1713  BP: 102/62  Pulse: (!) 110  Resp: 18  Temp: 98.3 F (36.8 C)     FHT: 141  Lab orders placed from triage:

## 2024-08-29 ENCOUNTER — Ambulatory Visit

## 2024-08-29 ENCOUNTER — Ambulatory Visit: Attending: Obstetrics and Gynecology | Admitting: Maternal & Fetal Medicine

## 2024-08-29 VITALS — BP 133/64 | HR 55

## 2024-08-29 DIAGNOSIS — Z3A36 36 weeks gestation of pregnancy: Secondary | ICD-10-CM | POA: Diagnosis not present

## 2024-08-29 DIAGNOSIS — E669 Obesity, unspecified: Secondary | ICD-10-CM | POA: Diagnosis not present

## 2024-08-29 DIAGNOSIS — O99513 Diseases of the respiratory system complicating pregnancy, third trimester: Secondary | ICD-10-CM | POA: Insufficient documentation

## 2024-08-29 DIAGNOSIS — O43123 Velamentous insertion of umbilical cord, third trimester: Secondary | ICD-10-CM | POA: Insufficient documentation

## 2024-08-29 DIAGNOSIS — O99213 Obesity complicating pregnancy, third trimester: Secondary | ICD-10-CM | POA: Diagnosis not present

## 2024-08-29 DIAGNOSIS — J4520 Mild intermittent asthma, uncomplicated: Secondary | ICD-10-CM | POA: Insufficient documentation

## 2024-08-29 DIAGNOSIS — Z8759 Personal history of other complications of pregnancy, childbirth and the puerperium: Secondary | ICD-10-CM

## 2024-08-29 DIAGNOSIS — Z8632 Personal history of gestational diabetes: Secondary | ICD-10-CM | POA: Insufficient documentation

## 2024-08-29 DIAGNOSIS — Z3689 Encounter for other specified antenatal screening: Secondary | ICD-10-CM | POA: Insufficient documentation

## 2024-08-29 DIAGNOSIS — O09293 Supervision of pregnancy with other poor reproductive or obstetric history, third trimester: Secondary | ICD-10-CM | POA: Diagnosis not present

## 2024-08-29 DIAGNOSIS — O0990 Supervision of high risk pregnancy, unspecified, unspecified trimester: Secondary | ICD-10-CM

## 2024-08-29 DIAGNOSIS — J45909 Unspecified asthma, uncomplicated: Secondary | ICD-10-CM

## 2024-08-29 NOTE — Progress Notes (Signed)
 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 [id number] at [redacted]w[redacted]d here for ultrasound and consultation. Chelsea Herman is doing well today with no acute concerns. Today we focused on the following:   The patient is here for an ultrasound due to a velamentous cord insertion.  I discussed the importance of monitoring for fetal movement.  She will return for weekly nonstress test with delivery around 39 weeks.  She did express some concern about stillbirth related to velamentous cord insertion and wanted delivery earlier than 39 weeks.  I discussed that this is not recommended by any guidelines and there can be potential complications with early delivery.  The patient had time to ask questions that were answered to her satisfaction.  She verbalized understanding and agrees to proceed with the plan below.  Sonographic findings Single intrauterine pregnancy. Fetal cardiac activity: Observed. Presentation: Cephalic. Interval fetal anatomy appears normal. Fetal biometry shows the estimated fetal weight at the 57 percentile. Amniotic fluid: Within normal limits.  MVP: 6.59 cm. Placenta: Anterior. BPP: 8/8.   There are limitations of prenatal ultrasound such as the inability to detect certain  abnormalities due to poor visualization. Various factors such as fetal position, gestational age and maternal body habitus may increase the difficulty in visualizing the fetal anatomy.    Recommendations -Weekly antenatal testing until delivery at 39 weeks  Review of Systems: A review of systems was performed and was negative except per HPI   Vitals and Physical Exam    08/29/2024   12:53 PM 08/26/2024    5:13 PM 08/17/2024    5:41 AM  Vitals with BMI  Height  5' 5   Weight  197 lbs   BMI  32.78   Systolic 133 102 889  Diastolic 64 62 53  Pulse 55 110 70    Sitting comfortably on the sonogram table Nonlabored breathing Normal rate and rhythm Abdomen is nontender  Past pregnancies OB History  Gravida Para Term Preterm AB Living  4 2 2  0 1 2  SAB IAB Ectopic Multiple Live Births  1 0 0 0 2    # Outcome Date GA Lbr Len/2nd Weight Sex Type Anes PTL Lv  4 Current           3 SAB 11/13/23 [redacted]w[redacted]d         2 Term 01/02/23 [redacted]w[redacted]d 01:00 7 lb 15 oz (3.6 kg) M Vag-Spont None  LIV     Birth Comments: GDM  1 Term 02/04/21 [redacted]w[redacted]d 03:34 / 00:31 7 lb 10.2 oz (3.464 kg) F Vag-Spont EPI  LIV     Birth Comments: GHTN     I spent 20 minutes reviewing the patients chart, including labs and images as well as counseling the patient about her medical conditions. Greater than 50% of the time was spent in  direct face-to-face patient counseling.  Chelsea Herman  MFM, [place]   08/29/2024  4:53 PM

## 2024-08-31 ENCOUNTER — Other Ambulatory Visit: Payer: Self-pay

## 2024-08-31 ENCOUNTER — Inpatient Hospital Stay (HOSPITAL_COMMUNITY)
Admission: AD | Admit: 2024-08-31 | Discharge: 2024-08-31 | Disposition: A | Discharge status: Home / Self Care | Attending: Obstetrics & Gynecology | Admitting: Obstetrics & Gynecology

## 2024-08-31 ENCOUNTER — Encounter (HOSPITAL_COMMUNITY): Payer: Self-pay | Admitting: Obstetrics & Gynecology

## 2024-08-31 DIAGNOSIS — Z3A36 36 weeks gestation of pregnancy: Secondary | ICD-10-CM | POA: Diagnosis not present

## 2024-08-31 DIAGNOSIS — O26893 Other specified pregnancy related conditions, third trimester: Secondary | ICD-10-CM

## 2024-08-31 DIAGNOSIS — R519 Headache, unspecified: Secondary | ICD-10-CM

## 2024-08-31 DIAGNOSIS — O209 Hemorrhage in early pregnancy, unspecified: Secondary | ICD-10-CM

## 2024-08-31 DIAGNOSIS — O99891 Other specified diseases and conditions complicating pregnancy: Secondary | ICD-10-CM | POA: Insufficient documentation

## 2024-08-31 DIAGNOSIS — K0889 Other specified disorders of teeth and supporting structures: Secondary | ICD-10-CM

## 2024-08-31 DIAGNOSIS — O0990 Supervision of high risk pregnancy, unspecified, unspecified trimester: Secondary | ICD-10-CM

## 2024-08-31 DIAGNOSIS — O0993 Supervision of high risk pregnancy, unspecified, third trimester: Secondary | ICD-10-CM | POA: Insufficient documentation

## 2024-08-31 LAB — URINALYSIS, ROUTINE W REFLEX MICROSCOPIC
Bilirubin Urine: NEGATIVE
Glucose, UA: NEGATIVE mg/dL
Hgb urine dipstick: NEGATIVE
Ketones, ur: NEGATIVE mg/dL
Leukocytes,Ua: NEGATIVE
Nitrite: NEGATIVE
Protein, ur: 30 mg/dL — AB
Specific Gravity, Urine: 1.025 (ref 1.005–1.030)
pH: 5 (ref 5.0–8.0)

## 2024-08-31 MED ORDER — OXYCODONE-ACETAMINOPHEN 5-325 MG PO TABS: 1.0000 | ORAL_TABLET | Freq: Four times a day (QID) | ORAL | 0 refills | Status: DC | PRN

## 2024-08-31 MED ORDER — ACETAMINOPHEN-CAFFEINE 500-65 MG PO TABS
2.0000 | ORAL_TABLET | Freq: Once | ORAL | Status: AC
  Administered 2024-08-31: 2 via ORAL
  Filled 2024-08-31: qty 2

## 2024-08-31 MED ORDER — GUAIFENESIN ER 600 MG PO TB12
600.0000 mg | ORAL_TABLET | Freq: Two times a day (BID) | ORAL | Status: DC
  Administered 2024-08-31: 600 mg via ORAL
  Filled 2024-08-31 (×2): qty 1

## 2024-08-31 NOTE — MAU Provider Note (Signed)
 History     CSN: [id number]  Arrival date and time: 08/31/24 0934   Event Date/Time   First Provider Initiated Contact with Patient 08/31/24 1039      Chief Complaint  Patient presents with   Abdominal Pain   Emesis   Headache   Chelsea Herman , a  18 y.o. [phone] at [redacted]w[redacted]d presents to MAU with complaints of a headache, nausea and vomiting and abdominal pain. Patient reports symptoms started 2 days ago. She recently had dental surgery and reports that her headache and ear pain is on the same side of her surgery. Reports that she attempted PO tylenol  yesterday with minimal relief. Currently rates pain as a 8/10. Denies symptoms worsened by sound but is worsened by light. She also reports nausea and vomiting. States that she has had 3 episodes of emesis last one was this morning after she gargled with salt water . She denies nausea at this time. She also has complaints for upper abdominal pain that is intermittent. Currently rates as a 0/10. Denies any other sick contacts or exposures but endorses some sinus and nasal congestion. No SOB or difficulty breathing. She denies vaginal bleeding, leaking of fluid and endorses positive fetal movement.   Does have a hx of asthma but denies having to use an inhaler in years.        OB History     Gravida  4   Para  2   Term  2   Preterm  0   AB  1   Living  2      SAB  1   IAB  0   Ectopic  0   Multiple  0   Live Births  2           Past Medical History:  Diagnosis Date   Asthma    as a child-no current medications or treatments   Diabetes mellitus without complication (HCC)    Gestational diabetes    History of gestational hypertension 02/04/2021   Hypertension    Migraines     Past Surgical History:  Procedure Laterality Date   NO PAST SURGERIES      Family History  Problem Relation Age of Onset   Hearing loss Mother    Asthma Neg Hx    Cancer Neg Hx    Diabetes Neg Hx    Stroke Neg Hx     Social  History   Tobacco Use   Smoking status: Never    Passive exposure: Never   Smokeless tobacco: Never  Vaping Use   Vaping status: Never Used  Substance Use Topics   Alcohol use: Never   Drug use: Never    Allergies:  Allergies  Allergen Reactions   Peanut-Containing Drug Products Anaphylaxis   Citric Acid Rash    Medications Prior to Admission  Medication Sig Dispense Refill Last Dose/Taking   acetaminophen  (TYLENOL ) 500 MG tablet Take 2 tablets (1,000 mg total) by mouth every 6 (six) hours as needed for mild pain (pain score 1-3). 60 tablet 2 08/30/2024   amoxicillin -clavulanate (AUGMENTIN ) 875-125 MG tablet Take 1 tablet by mouth 2 (two) times daily. 14 tablet 0 Past Week   cyclobenzaprine  (FLEXERIL ) 10 MG tablet Take 1 tablet (10 mg total) by mouth 2 (two) times daily as needed for muscle spasms. 20 tablet 1 Past Month   oxyCODONE -acetaminophen  (PERCOCET) 5-325 MG tablet Take 1-2 tablets by mouth every 4 (four) hours as needed for severe pain (pain score 7-10). (Patient not  taking: Reported on 08/29/2024) 15 tablet 0    aspirin  EC 81 MG tablet Take 1 tablet (81 mg total) by mouth daily. Swallow whole. 30 tablet 12    Blood Pressure Monitoring (BLOOD PRESSURE KIT) DEVI 1 Device by Does not apply route as needed. 1 each 0    ondansetron  (ZOFRAN -ODT) 4 MG disintegrating tablet Take 1 tablet (4 mg total) by mouth every 6 (six) hours as needed for nausea or vomiting. 20 tablet 1 More than a month   oxyCODONE  (ROXICODONE ) 5 MG immediate release tablet Take 1-2 tablets (5-10 mg total) by mouth every 6 (six) hours as needed for severe pain (pain score 7-10). (Patient not taking: Reported on 08/29/2024) 15 tablet 0    Prenatal Vit-Fe Fumarate-FA (PREPLUS) 27-1 MG TABS Take 1 tablet by mouth daily. 30 tablet 13     Review of Systems  Constitutional:  Positive for fatigue. Negative for chills and fever.  HENT:  Positive for congestion, sinus pressure and sore throat.   Eyes:  Negative for pain  and visual disturbance.  Respiratory:  Negative for apnea, cough, chest tightness, shortness of breath and wheezing.   Cardiovascular:  Negative for chest pain and palpitations.  Gastrointestinal:  Positive for abdominal pain, nausea and vomiting. Negative for constipation and diarrhea.  Endocrine: Negative for cold intolerance and heat intolerance.  Genitourinary:  Negative for difficulty urinating, dysuria, pelvic pain, vaginal bleeding, vaginal discharge and vaginal pain.  Musculoskeletal:  Negative for back pain.  Neurological:  Negative for dizziness, seizures, facial asymmetry, weakness, light-headedness, numbness and headaches.  Psychiatric/Behavioral:  Negative for suicidal ideas.    Physical Exam   Blood pressure 121/74, pulse 93, temperature (!) 97.4 F (36.3 C), temperature source Oral, resp. rate 18, height 5' 5 (1.651 m), weight 87.8 kg, last menstrual period 12/21/2023, SpO2 99%, unknown if currently breastfeeding.  Physical Exam Vitals and nursing note reviewed.  Constitutional:      General: She is not in acute distress.    Appearance: Normal appearance.  HENT:     Head: Normocephalic.  Pulmonary:     Effort: Pulmonary effort is normal.  Abdominal:     Tenderness: There is no abdominal tenderness.  Musculoskeletal:     Cervical back: Normal range of motion.  Skin:    General: Skin is warm and dry.  Neurological:     Mental Status: She is alert and oriented to person, place, and time.  Psychiatric:        Mood and Affect: Mood normal.    FHT: 150 bpm with moderate variability 15x15 accels no decels.  Toco: quiet  MAU Course  Procedures Orders Placed This Encounter  Procedures   Resp panel by RT-PCR (RSV, Flu A&B, Covid) Anterior Nasal Swab   Urinalysis, Routine w reflex microscopic -Urine, Clean Catch   Airborne and Contact precautions   Meds ordered this encounter  Medications   acetaminophen -caffeine  (EXCEDRIN TENSION HEADACHE) 500-65 MG per tablet 2  tablet   guaiFENesin  (MUCINEX ) 12 hr tablet 600 mg    MDM - Recommendation for Resp culture for assess for COVID flu RSV etc and patient declined.  - Excedrin given for headache.  - Mucinex  given  - Patient reports headache improved however still complaining of face pain, where the tooth was extracted.  - Offered a short course of percocet for surgical pain to get through rest of the weekend, but strongly recommended that patient follow up with her dental surgeon on Monday.  - plan for discharge.  Assessment and Plan   1. Supervision of high risk pregnancy, antepartum   2. Vaginal bleeding in pregnancy, first trimester   3. Tooth pain   4. [redacted] weeks gestation of pregnancy   5. Intractable headache, unspecified chronicity pattern, unspecified headache type    - Reviewed recommendation to follow up with Dental surgeon.  - Rx for percocet sent to outpatient pharmacy.  - reviewed worsening signs and return precautions.  - Patient discharged home in stable condition and may return to MAU as needed.    Claris CHRISTELLA Cedar, MSN CNM  08/31/2024, 10:39 AM

## 2024-08-31 NOTE — MAU Note (Signed)
 Chelsea Herman is a 18 y.o. at [redacted]w[redacted]d here in MAU reporting: headache that started yesterday and has tried tylenol  with no relief. Took tylenol  1000mg  last night at 1900. Feels like she has lost her hearing in her right ear. Has tooth pulled on right side of jaw on Tuesday. Is not on any abx currently. Reporting upper mid abdominal pain that started yesterday, feels like a really bad stomach ache. Pain comes and goes. Has vomited 3x yesterday-today. Soft/watery BM yesterday but has been ongoing for the last week. Denies any LOF or VB. Reports some movement but not as much as normal.  Sinus congestion and sore throat that started 2 days ago.   Onset of complaint: yesterday  Pain score: 6 cramping abdomen  Vitals:   08/31/24 0953  BP: 120/71  Pulse: (!) 103  Resp: 18  Temp: (!) 97.4 F (36.3 C)  SpO2: 97%     QYU:[id number], pt wearing a dress  Lab orders placed from triage:

## 2024-09-01 ENCOUNTER — Encounter: Payer: Self-pay | Admitting: Advanced Practice Midwife

## 2024-09-02 ENCOUNTER — Other Ambulatory Visit: Payer: Self-pay

## 2024-09-02 ENCOUNTER — Encounter (HOSPITAL_COMMUNITY): Payer: Self-pay | Admitting: Obstetrics and Gynecology

## 2024-09-02 ENCOUNTER — Encounter: Payer: Self-pay | Admitting: Advanced Practice Midwife

## 2024-09-02 ENCOUNTER — Inpatient Hospital Stay (HOSPITAL_COMMUNITY)
Admission: AD | Admit: 2024-09-02 | Discharge: 2024-09-02 | Disposition: A | Discharge status: Home / Self Care | Payer: Self-pay | Attending: Obstetrics and Gynecology | Admitting: Obstetrics and Gynecology

## 2024-09-02 DIAGNOSIS — E876 Hypokalemia: Secondary | ICD-10-CM | POA: Diagnosis not present

## 2024-09-02 DIAGNOSIS — G43011 Migraine without aura, intractable, with status migrainosus: Secondary | ICD-10-CM | POA: Diagnosis not present

## 2024-09-02 DIAGNOSIS — O99283 Endocrine, nutritional and metabolic diseases complicating pregnancy, third trimester: Secondary | ICD-10-CM | POA: Diagnosis not present

## 2024-09-02 DIAGNOSIS — Z3A36 36 weeks gestation of pregnancy: Secondary | ICD-10-CM

## 2024-09-02 DIAGNOSIS — O99353 Diseases of the nervous system complicating pregnancy, third trimester: Secondary | ICD-10-CM | POA: Insufficient documentation

## 2024-09-02 DIAGNOSIS — E860 Dehydration: Secondary | ICD-10-CM | POA: Diagnosis not present

## 2024-09-02 DIAGNOSIS — O26893 Other specified pregnancy related conditions, third trimester: Secondary | ICD-10-CM

## 2024-09-02 LAB — URINALYSIS, ROUTINE W REFLEX MICROSCOPIC
Bacteria, UA: NONE SEEN
Bilirubin Urine: NEGATIVE
Glucose, UA: NEGATIVE mg/dL
Hgb urine dipstick: NEGATIVE
Ketones, ur: NEGATIVE mg/dL
Nitrite: NEGATIVE
Protein, ur: NEGATIVE mg/dL
Specific Gravity, Urine: 1.014 (ref 1.005–1.030)
pH: 7 (ref 5.0–8.0)

## 2024-09-02 LAB — CBC
HCT: 29.7 % — ABNORMAL LOW (ref 36.0–46.0)
Hemoglobin: 9.5 g/dL — ABNORMAL LOW (ref 12.0–15.0)
MCH: 25.5 pg — ABNORMAL LOW (ref 26.0–34.0)
MCHC: 32 g/dL (ref 30.0–36.0)
MCV: 79.8 fL — ABNORMAL LOW (ref 80.0–100.0)
Platelets: 192 K/uL (ref 150–400)
RBC: 3.72 MIL/uL — ABNORMAL LOW (ref 3.87–5.11)
RDW: 13 % (ref 11.5–15.5)
WBC: 5.2 K/uL (ref 4.0–10.5)
nRBC: 0 % (ref 0.0–0.2)

## 2024-09-02 LAB — COMPREHENSIVE METABOLIC PANEL WITH GFR
ALT: 9 U/L (ref 0–44)
AST: 24 U/L (ref 15–41)
Albumin: 2.7 g/dL — ABNORMAL LOW (ref 3.5–5.0)
Alkaline Phosphatase: 128 U/L — ABNORMAL HIGH (ref 38–126)
Anion gap: 13 (ref 5–15)
BUN: <5 mg/dL — ABNORMAL LOW (ref 6–20)
CO2: 17 mmol/L — ABNORMAL LOW (ref 22–32)
Calcium: 8.3 mg/dL — ABNORMAL LOW (ref 8.9–10.3)
Chloride: 104 mmol/L (ref 98–111)
Creatinine, Ser: 0.8 mg/dL (ref 0.44–1.00)
GFR, Estimated: >60 mL/min (ref >60)
Glucose, Bld: 139 mg/dL — ABNORMAL HIGH (ref 70–99)
Potassium: 2.8 mmol/L — ABNORMAL LOW (ref 3.5–5.1)
Sodium: 134 mmol/L — ABNORMAL LOW (ref 135–145)
Total Bilirubin: 0.4 mg/dL (ref 0.0–1.2)
Total Protein: 5.7 g/dL — ABNORMAL LOW (ref 6.5–8.1)

## 2024-09-02 LAB — PROTEIN / CREATININE RATIO, URINE
Creatinine, Urine: 180 mg/dL
Protein Creatinine Ratio: 0.07 mg/mg{creat} (ref 0.00–0.15)
Total Protein, Urine: 12 mg/dL

## 2024-09-02 MED ORDER — METHYLPREDNISOLONE SODIUM SUCC 125 MG IJ SOLR
80.0000 mg | Freq: Once | INTRAMUSCULAR | Status: AC
  Administered 2024-09-02: 80 mg via INTRAVENOUS
  Filled 2024-09-02: qty 2

## 2024-09-02 MED ORDER — MAGNESIUM SULFATE 2 GM/50ML IV SOLN
2.0000 g | Freq: Once | INTRAVENOUS | Status: AC
  Administered 2024-09-02: 2 g via INTRAVENOUS
  Filled 2024-09-02: qty 50

## 2024-09-02 MED ORDER — METOCLOPRAMIDE HCL 5 MG/ML IJ SOLN
10.0000 mg | Freq: Four times a day (QID) | INTRAMUSCULAR | Status: DC | PRN
  Administered 2024-09-02: 10 mg via INTRAVENOUS
  Filled 2024-09-02: qty 2

## 2024-09-02 MED ORDER — METOCLOPRAMIDE HCL 10 MG PO TABS: 10.0000 mg | ORAL_TABLET | Freq: Four times a day (QID) | ORAL | 0 refills | Status: DC

## 2024-09-02 MED ORDER — POTASSIUM CHLORIDE CRYS ER 20 MEQ PO TBCR
40.0000 meq | EXTENDED_RELEASE_TABLET | Freq: Once | ORAL | Status: AC
  Administered 2024-09-02: 40 meq via ORAL
  Filled 2024-09-02: qty 2

## 2024-09-02 MED ORDER — LACTATED RINGERS IV BOLUS
1000.0000 mL | Freq: Once | INTRAVENOUS | Status: AC
  Administered 2024-09-02: 1000 mL via INTRAVENOUS

## 2024-09-02 MED ORDER — POTASSIUM CHLORIDE 10 MEQ/100ML IV SOLN
10.0000 meq | INTRAVENOUS | Status: AC
Start: 2024-09-02 — End: 2024-09-02
  Administered 2024-09-02 (×2): 10 meq via INTRAVENOUS
  Filled 2024-09-02 (×2): qty 100

## 2024-09-02 MED ORDER — POTASSIUM CHLORIDE CRYS ER 20 MEQ PO TBCR: 40.0000 meq | EXTENDED_RELEASE_TABLET | Freq: Two times a day (BID) | ORAL | 0 refills | Status: DC

## 2024-09-02 MED ORDER — DIPHENHYDRAMINE HCL 50 MG/ML IJ SOLN
25.0000 mg | Freq: Once | INTRAMUSCULAR | Status: AC
  Administered 2024-09-02: 25 mg via INTRAVENOUS
  Filled 2024-09-02: qty 1

## 2024-09-02 NOTE — MAU Note (Signed)
 18 yo G4P2 presents with CC of migraines behind right eye, flashing sensation in right eye, emesis (3x yesterday and 1x today), and diarrhea (2x yesterday and 2x today). Was seen in MAU two days for sinus infection and was given antibiotics that she is still taken. Has taken tylenol  and oxycodone  without improvement in pain. Also reports pain and pressure in lower abdomen that she has iced but has not improved. +FM. Denies LOF and bleeding. Denies contractions but states that abdomen just feels weird.

## 2024-09-02 NOTE — MAU Provider Note (Signed)
 Obstetric Attending MAU Note  Chief Complaint:  No chief complaint on file.   Event Date/Time   First Provider Initiated Contact with Patient 09/02/24 1749     HPI: Chelsea Herman is a 18 y.o. [id number] at [redacted]w[redacted]d who presents to maternity admissions reporting severe headache. Has h/o migraines. She was here for same. H/A is right sided and has n/v and flashing lights like her typical migraines. H/o dental tooth removal. On Abx. Denies contractions, leakage of fluid or vaginal bleeding. Good fetal movement.   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   History of gestational diabetes 11/01/2022   History of gestational hypertension 02/04/2021   Mild intermittent asthma without complication 08/07/2017   Peanut allergy 08/07/2017    Past Medical History:  Diagnosis Date   Asthma    as a child-no current medications or treatments   Gestational diabetes 2024   History of gestational hypertension 02/04/2021   Hypertension    Migraines     OB History  Gravida Para Term Preterm AB Living  4 2 2  0 1 2  SAB IAB Ectopic Multiple Live Births  1 0 0 0 2    # Outcome Date GA Lbr Len/2nd Weight Sex Type Anes PTL Lv  4 Current           3 SAB 11/13/23 [redacted]w[redacted]d         2 Term 01/02/23 [redacted]w[redacted]d 01:00 3600 g M Vag-Spont None  LIV     Birth Comments: GDM  1 Term 02/04/21 [redacted]w[redacted]d 03:34 / 00:31 3464 g F Vag-Spont EPI  LIV     Birth Comments: GHTN    Past Surgical History:  Procedure Laterality Date   NO PAST SURGERIES      Family History: Family History  Problem Relation Age of Onset   Hearing loss Mother    Asthma Neg Hx    Cancer Neg Hx    Diabetes Neg Hx    Stroke Neg Hx      Social History: Social History   Tobacco Use   Smoking status: Never    Passive exposure: Never   Smokeless tobacco: Never  Vaping Use   Vaping status: Never Used  Substance Use Topics   Alcohol use: Never   Drug use: Never    Allergies:  Allergies  Allergen Reactions   Peanut-Containing Drug Products Anaphylaxis   Citric Acid Rash    Medications Prior to Admission  Medication Sig Dispense Refill Last Dose/Taking   acetaminophen  (TYLENOL ) 500 MG tablet Take 2 tablets (1,000 mg total) by mouth every 6 (six) hours as needed for mild pain (pain score 1-3). 60 tablet 2 09/02/2024   amoxicillin -clavulanate (AUGMENTIN ) 875-125 MG tablet Take 1 tablet by mouth 2 (two) times daily. 14 tablet 0 09/02/2024   cyclobenzaprine  (FLEXERIL ) 10 MG tablet Take 1 tablet (10 mg total) by mouth 2 (two) times daily as needed for muscle spasms. 20 tablet 1 Past Week   oxyCODONE -acetaminophen  (PERCOCET) 5-325 MG tablet Take 1-2 tablets by mouth every 4 (four) hours as needed for severe pain (pain score 7-10). (Patient not taking: Reported on 08/29/2024) 15 tablet 0    oxyCODONE -acetaminophen  (PERCOCET/ROXICET) 5-325 MG tablet Take 1 tablet by mouth  every 6 (six) hours as needed for severe pain (pain score 7-10). 5 tablet 0 09/01/2024   aspirin  EC 81 MG tablet Take 1 tablet (81 mg total) by mouth daily. Swallow whole. 30 tablet 12    Blood Pressure Monitoring (BLOOD PRESSURE KIT) DEVI 1 Device by Does not apply route as needed. 1 each 0    ondansetron  (ZOFRAN -ODT) 4 MG disintegrating tablet Take 1 tablet (4 mg total) by mouth every 6 (six) hours as needed for nausea or vomiting. 20 tablet 1 More than a month   oxyCODONE  (ROXICODONE ) 5 MG immediate release tablet Take 1-2 tablets (5-10 mg total) by mouth every 6 (six) hours as needed for severe pain (pain score 7-10). 15 tablet 0    Prenatal Vit-Fe Fumarate-FA (PREPLUS) 27-1 MG TABS Take 1 tablet by mouth daily. 30 tablet 13     ROS: Pertinent findings in  history of present illness.  Physical Exam  Blood pressure 124/72, pulse 65, temperature 98.1 F (36.7 C), temperature source Oral, resp. rate 18, height 5' 5 (1.651 m), weight 89.4 kg, last menstrual period 12/21/2023, SpO2 100%, unknown if currently breastfeeding. CONSTITUTIONAL: Well-developed, well-nourished female in no acute distress.  HENT:  Normocephalic, atraumatic, External right and left ear normal. Oropharynx is clear and moist EYES: Conjunctivae and EOM are normal. Pupils are equal, round, and reactive to light. No scleral icterus.  NECK: Normal range of motion, supple, no masses SKIN: Skin is warm and dry. No rash noted. Not diaphoretic. No erythema. No pallor. NEUROLGIC: Alert and oriented to person, place, and time. Normal reflexes, muscle tone coordination. No cranial nerve deficit noted. PSYCHIATRIC: Normal mood and affect. Normal behavior. Normal judgment and thought content. CARDIOVASCULAR: Normal heart rate noted, regular rhythm RESPIRATORY: Effort and breath sounds normal, no problems with respiration noted ABDOMEN: Soft, nontender, nondistended, gravid appropriate for gestational age MUSCULOSKELETAL: Normal range of motion. No edema and no tenderness. 2+ distal pulses.  SPECULUM EXAM: NEFG, physiologic discharge, no blood, cervix clean    FHT:  Baseline 150 , moderate variability, accelerations present, no decelerations Contractions: quiet   Labs: Results for orders placed or performed during the hospital encounter of 09/02/24 (from the past 24 hours)  Urinalysis, Routine w reflex microscopic -Urine, Clean Catch     Status: Abnormal   Collection Time: 09/02/24  5:31 PM  Result Value Ref Range   Color, Urine YELLOW YELLOW   APPearance HAZY (A) CLEAR   Specific Gravity, Urine 1.014 1.005 - 1.030   pH 7.0 5.0 - 8.0   Glucose, UA NEGATIVE NEGATIVE mg/dL   Hgb urine dipstick NEGATIVE NEGATIVE   Bilirubin Urine NEGATIVE NEGATIVE   Ketones, ur NEGATIVE NEGATIVE  mg/dL   Protein, ur NEGATIVE NEGATIVE mg/dL   Nitrite NEGATIVE NEGATIVE   Leukocytes,Ua TRACE (A) NEGATIVE   RBC / HPF 0-5 0 - 5 RBC/hpf   WBC, UA 6-10 0 - 5 WBC/hpf   Bacteria, UA NONE SEEN NONE SEEN   Squamous Epithelial / HPF 11-20 0 - 5 /HPF  Protein / creatinine ratio, urine     Status: None   Collection Time: 09/02/24  5:31 PM  Result Value Ref Range   Creatinine, Urine 180 mg/dL   Total Protein, Urine 12 mg/dL   Protein Creatinine Ratio 0.07 0.00 - 0.15 mg/mg[Cre]  Comprehensive metabolic panel     Status: Abnormal   Collection Time: 09/02/24  6:36 PM  Result Value Ref Range   Sodium 134 (L) 135 - 145 mmol/L   Potassium  2.8 (L) 3.5 - 5.1 mmol/L   Chloride 104 98 - 111 mmol/L   CO2 17 (L) 22 - 32 mmol/L   Glucose, Bld 139 (H) 70 - 99 mg/dL   BUN <5 (L) 6 - 20 mg/dL   Creatinine, Ser 9.19 0.44 - 1.00 mg/dL   Calcium 8.3 (L) 8.9 - 10.3 mg/dL   Total Protein 5.7 (L) 6.5 - 8.1 g/dL   Albumin 2.7 (L) 3.5 - 5.0 g/dL   AST 24 15 - 41 U/L   ALT 9 0 - 44 U/L   Alkaline Phosphatase 128 (H) 38 - 126 U/L   Total Bilirubin 0.4 0.0 - 1.2 mg/dL   GFR, Estimated >39 >39 mL/min   Anion gap 13 5 - 15  CBC     Status: Abnormal   Collection Time: 09/02/24  6:36 PM  Result Value Ref Range   WBC 5.2 4.0 - 10.5 K/uL   RBC 3.72 (L) 3.87 - 5.11 MIL/uL   Hemoglobin 9.5 (L) 12.0 - 15.0 g/dL   HCT 70.2 (L) 63.9 - 53.9 %   MCV 79.8 (L) 80.0 - 100.0 fL   MCH 25.5 (L) 26.0 - 34.0 pg   MCHC 32.0 30.0 - 36.0 g/dL   RDW 86.9 88.4 - 84.4 %   Platelets 192 150 - 400 K/uL   nRBC 0.0 0.0 - 0.2 %    Imaging:  US  MFM OB FOLLOW UP Result Date: 08/29/2024 ----------------------------------------------------------------------  OBSTETRICS REPORT                       (Signed Final 08/29/2024 04:55 pm) ---------------------------------------------------------------------- Patient Info  ID #:       [id number]                          D.O.B.:  May 11, 2006 (17 yrs)(F)  Name:       Sheng Polidori                    Visit Date: 08/29/2024 12:39 pm ---------------------------------------------------------------------- Performed By  Attending:        Delora Smaller DO       Ref. Address:     [address]                                                             [place], [place]                                                             [address]  Performed By:     Rumaldo Sharps RDMS      [facility]  Referred By:      [facility] ---------------------------------------------------------------------- Orders  #  Description                           Code        Ordered By  1  US  MFM OB FOLLOW UP                   [id number]    BURK SCHAIBLE  2  US  MFM FETAL BPP WO NON               76819.01    [facility]     STRESS ----------------------------------------------------------------------  #  Order #                     Accession #                Episode #  1  [id number]                   [id number]                 [id number]  2  [id number]                   [id number]                 [id number] ---------------------------------------------------------------------- Indications  Obesity complicating pregnancy, third          O99.213  trimester (pregravid BMI 32)  Velamentous insertion of umbilical cord        O43.129  Poor obstetric history: Previous               O09.299  preeclampsia/eclampsia/gestational HTN  Poor obstetric history: Previous gestational   O09.299  diabetes  Neg AFP  Normal 2 Hr GTT  Encounter for other antenatal screening        Z36.2  follow-up  [redacted] weeks gestation of pregnancy                Z3A.36 ---------------------------------------------------------------------- Vital Signs  BP:          133/64  ---------------------------------------------------------------------- Fetal Evaluation  Num Of Fetuses:         1  Fetal Heart Rate(bpm):  145  Cardiac Activity:       Observed  Presentation:           Cephalic  Placenta:               Anterior  P. Cord Insertion:      Previously seen - velamentous  Amniotic Fluid  AFI FV:      Within normal limits  AFI Sum(cm)     %Tile       Largest Pocket(cm)  14.61           53          6.59  RUQ(cm)       RLQ(cm)  LUQ(cm)        LLQ(cm)  6.59          3.24          4.78           0 ---------------------------------------------------------------------- Biophysical Evaluation  Amniotic F.V:   Within normal limits       F. Tone:        Observed  F. Movement:    Observed                   Score:          8/8  F. Breathing:   Observed ---------------------------------------------------------------------- Biometry  BPD:      90.3  mm     G. Age:  36w 4d         74  %    CI:        77.37   %    70 - 86                                                          FL/HC:      20.6   %    20.1 - 22.1  HC:       325   mm     G. Age:  36w 6d         39  %    HC/AC:      0.99        0.93 - 1.11  AC:      329.2  mm     G. Age:  36w 6d         81  %    FL/BPD:     74.1   %    71 - 87  FL:       66.9  mm     G. Age:  34w 3d         11  %    FL/AC:      20.3   %    20 - 24  Est. FW:    2875  gm      6 lb 5 oz     57  % ---------------------------------------------------------------------- OB History  Blood Type:   A+  Gravidity:    4         Term:   2         SAB:   1  Living:       2 ---------------------------------------------------------------------- Gestational Age  LMP:           36w 0d        Date:  12/21/23                 EDD:   09/26/24  U/S Today:     36w 1d                                        EDD:   09/25/24  Best:          36w 0d     Det. By:  LMP  (12/21/23)          EDD:   09/26/24 ---------------------------------------------------------------------- Anatomy  Ventricles:  Appears normal         Stomach:                Appears normal, left                                                                        sided  Heart:                 Appears normal         Kidneys:                Appear normal                         (4CH, axis, and                         situs)  Diaphragm:             Appears normal         Bladder:                Appears normal ---------------------------------------------------------------------- Cervix Uterus Adnexa  Cervix  Not visualized (advanced GA >24wks)  Uterus  No abnormality visualized.  Right Ovary  Not visualized.  Left Ovary  Not visualized.  Cul De Sac  No free fluid seen.  Adnexa  No abnormality visualized ---------------------------------------------------------------------- Comments  Maternal Fetal medicine Consult  Shawndra Farfan is a 18 y.o. H5E7987 at [redacted]w[redacted]d here for  ultrasound and consultation. Jenesa Stanly is doing well  today with no acute concerns. Today we focused on the  following:  The patient is here for an ultrasound due to a velamentous  cord insertion.  I discussed the importance of monitoring for  fetal movement.  She will return for weekly nonstress test with  delivery around 39 weeks.  She did express some concern  about stillbirth related to velamentous cord insertion and  wanted delivery earlier than 39 weeks.  I discussed that this  is not recommended by any guidelines and there can be  potential complications with early delivery.  The patient had time to ask questions that were answered to  her satisfaction.  She verbalized understanding and agrees to  proceed with the plan below.  Sonographic findings  Single intrauterine pregnancy.  Fetal cardiac activity: Observed.  Presentation: Cephalic.  Interval fetal anatomy appears normal.  Fetal biometry shows the estimated fetal weight at the 57  percentile.  Amniotic fluid: Within normal limits.  MVP: 6.59 cm.  Placenta: Anterior.  BPP: 8/8.  There are limitations of  prenatal ultrasound such as the  inability to detect certain abnormalities due to poor  visualization. Various factors such as fetal position,  gestational age and maternal body habitus may increase the  difficulty in visualizing the fetal anatomy.  Recommendations  -Weekly antenatal testing until delivery at 39 weeks ----------------------------------------------------------------------                 Delora Smaller, DO Electronically Signed Final Report   08/29/2024 04:55 pm ----------------------------------------------------------------------   US  MFM FETAL BPP WO NON STRESS Result Date: 08/29/2024 ----------------------------------------------------------------------  OBSTETRICS REPORT                       (  Signed Final 08/29/2024 04:55 pm) ---------------------------------------------------------------------- Patient Info  ID #:       [id number]                          D.O.B.:  Aug 29, 2006 (17 yrs)(F)  Name:       Chelsea Herman                   Visit Date: 08/29/2024 12:39 pm ---------------------------------------------------------------------- Performed By  Attending:        Delora Smaller DO       Ref. Address:     [address]                                                             [place], [place]                                                             [address]  Performed By:     Rumaldo Sharps RDMS      Location[facility]         [facility]  Referred By:      [facility] ---------------------------------------------------------------------- Orders  #  Description                           Code        Ordered By  1  US  MFM OB FOLLOW UP                   76816.01    BURK SCHAIBLE  2  US  MFM FETAL BPP WO NON               23180.98    BURK SCHAIBLE     STRESS  ----------------------------------------------------------------------  #  Order #  Accession #                Episode #  1  [id number]                   [id number]                 [id number]  2  [id number]                   [id number]                 [id number] ---------------------------------------------------------------------- Indications  Obesity complicating pregnancy, third          O99.213  trimester (pregravid BMI 32)  Velamentous insertion of umbilical cord        O43.129  Poor obstetric history: Previous               O09.299  preeclampsia/eclampsia/gestational HTN  Poor obstetric history: Previous gestational   O09.299  diabetes  Neg AFP  Normal 2 Hr GTT  Encounter for other antenatal screening        Z36.2  follow-up  [redacted] weeks gestation of pregnancy                Z3A.36 ---------------------------------------------------------------------- Vital Signs  BP:          133/64 ---------------------------------------------------------------------- Fetal Evaluation  Num Of Fetuses:         1  Fetal Heart Rate(bpm):  145  Cardiac Activity:       Observed  Presentation:           Cephalic  Placenta:               Anterior  P. Cord Insertion:      Previously seen - velamentous  Amniotic Fluid  AFI FV:      Within normal limits  AFI Sum(cm)     %Tile       Largest Pocket(cm)  14.61           53          6.59  RUQ(cm)       RLQ(cm)       LUQ(cm)        LLQ(cm)  6.59          3.24          4.78           0 ---------------------------------------------------------------------- Biophysical Evaluation  Amniotic F.V:   Within normal limits       F. Tone:        Observed  F. Movement:    Observed                   Score:          8/8  F. Breathing:   Observed ---------------------------------------------------------------------- Biometry  BPD:      90.3  mm     G. Age:  36w 4d         74  %    CI:        77.37   %    70 - 86                                                          FL/HC:  20.6   %     20.1 - 22.1  HC:       325   mm     G. Age:  36w 6d         39  %    HC/AC:      0.99        0.93 - 1.11  AC:      329.2  mm     G. Age:  36w 6d         81  %    FL/BPD:     74.1   %    71 - 87  FL:       66.9  mm     G. Age:  34w 3d         11  %    FL/AC:      20.3   %    20 - 24  Est. FW:    2875  gm      6 lb 5 oz     57  % ---------------------------------------------------------------------- OB History  Blood Type:   A+  Gravidity:    4         Term:   2         SAB:   1  Living:       2 ---------------------------------------------------------------------- Gestational Age  LMP:           36w 0d        Date:  12/21/23                 EDD:   09/26/24  U/S Today:     36w 1d                                        EDD:   09/25/24  Best:          36w 0d     Det. By:  LMP  (12/21/23)          EDD:   09/26/24 ---------------------------------------------------------------------- Anatomy  Ventricles:            Appears normal         Stomach:                Appears normal, left                                                                        sided  Heart:                 Appears normal         Kidneys:                Appear normal                         (4CH, axis, and                         situs)  Diaphragm:             Appears normal  Bladder:                Appears normal ---------------------------------------------------------------------- Cervix Uterus Adnexa  Cervix  Not visualized (advanced GA >24wks)  Uterus  No abnormality visualized.  Right Ovary  Not visualized.  Left Ovary  Not visualized.  Cul De Sac  No free fluid seen.  Adnexa  No abnormality visualized ---------------------------------------------------------------------- Comments  Maternal Fetal medicine Consult  Wallace Neyra is a 18 y.o. H5E7987 at [redacted]w[redacted]d here for  ultrasound and consultation. Kamia Erdman is doing well  today with no acute concerns. Today we focused on the  following:  The patient is here for an ultrasound due to a  velamentous  cord insertion.  I discussed the importance of monitoring for  fetal movement.  She will return for weekly nonstress test with  delivery around 39 weeks.  She did express some concern  about stillbirth related to velamentous cord insertion and  wanted delivery earlier than 39 weeks.  I discussed that this  is not recommended by any guidelines and there can be  potential complications with early delivery.  The patient had time to ask questions that were answered to  her satisfaction.  She verbalized understanding and agrees to  proceed with the plan below.  Sonographic findings  Single intrauterine pregnancy.  Fetal cardiac activity: Observed.  Presentation: Cephalic.  Interval fetal anatomy appears normal.  Fetal biometry shows the estimated fetal weight at the 57  percentile.  Amniotic fluid: Within normal limits.  MVP: 6.59 cm.  Placenta: Anterior.  BPP: 8/8.  There are limitations of prenatal ultrasound such as the  inability to detect certain abnormalities due to poor  visualization. Various factors such as fetal position,  gestational age and maternal body habitus may increase the  difficulty in visualizing the fetal anatomy.  Recommendations  -Weekly antenatal testing until delivery at 39 weeks ----------------------------------------------------------------------                 Delora Smaller, DO Electronically Signed Final Report   08/29/2024 04:55 pm ----------------------------------------------------------------------    MAU Course: Given Benadryl , Reglan  and Solumedrol Given 1L LR K+low, given KCL 10 mEq x 2 and 40 mEq po, 2 gm Mag EKG normal Headache resolved  Assessment: 1. [redacted] weeks gestation of pregnancy   2. Intractable migraine without aura and with status migrainosus   3. Hypokalemia   4. Dehydration     Plan: Discharge home Labor precautions and fetal kick counts reviewed KCL repletion done Follow up with OB provider   Follow-up Information     [facility] Follow up.   Specialty: Obstetrics and Gynecology Contact information: [address] [place]  [address] [phone]                Allergies as of 09/02/2024       Reactions   Peanut-containing Drug Products Anaphylaxis   Citric Acid Rash        Medication List     TAKE these medications    acetaminophen  500 MG tablet Commonly known as: TYLENOL  Take 2 tablets (1,000 mg total) by mouth every 6 (six) hours as needed for mild pain (pain score 1-3).   amoxicillin -clavulanate 875-125 MG tablet Commonly known as: AUGMENTIN  Take 1 tablet by mouth 2 (two) times daily.   aspirin  EC 81 MG tablet Take 1 tablet (81 mg total) by mouth daily. Swallow whole.   Blood Pressure Kit Devi 1 Device by Does  not apply route as needed.   cyclobenzaprine  10 MG tablet Commonly known as: FLEXERIL  Take 1 tablet (10 mg total) by mouth 2 (two) times daily as needed for muscle spasms.   metoCLOPramide  10 MG tablet Commonly known as: REGLAN  Take 1 tablet (10 mg total) by mouth every 6 (six) hours.   ondansetron  4 MG disintegrating tablet Commonly known as: ZOFRAN -ODT Take 1 tablet (4 mg total) by mouth every 6 (six) hours as needed for nausea or vomiting.   oxyCODONE  5 MG immediate release tablet Commonly known as: Roxicodone  Take 1-2 tablets (5-10 mg total) by mouth every 6 (six) hours as needed for severe pain (pain score 7-10).   oxyCODONE -acetaminophen  5-325 MG tablet Commonly known as: Percocet Take 1-2 tablets by mouth every 4 (four) hours as needed for severe pain (pain score 7-10).   oxyCODONE -acetaminophen  5-325 MG tablet Commonly known as: PERCOCET/ROXICET Take 1 tablet by mouth every 6 (six) hours as needed for severe pain (pain score 7-10).   potassium chloride  SA 20 MEQ tablet Commonly known as: KLOR-CON  M Take 2 tablets (40 mEq total) by mouth 2 (two) times daily.   PrePLUS 27-1 MG Tabs Take  1 tablet by mouth daily.        Fredirick Glenys RAMAN, MD 09/02/2024 10:13 PM

## 2024-09-05 ENCOUNTER — Ambulatory Visit: Attending: Obstetrics and Gynecology

## 2024-09-05 ENCOUNTER — Telehealth: Payer: Self-pay | Admitting: Family Medicine

## 2024-09-05 DIAGNOSIS — Z3A37 37 weeks gestation of pregnancy: Secondary | ICD-10-CM | POA: Diagnosis not present

## 2024-09-05 DIAGNOSIS — O43123 Velamentous insertion of umbilical cord, third trimester: Secondary | ICD-10-CM | POA: Diagnosis present

## 2024-09-05 NOTE — Telephone Encounter (Signed)
 Attempted to contact pt unable to leave voicemail.    Kaceton Vieau,RN

## 2024-09-05 NOTE — Telephone Encounter (Signed)
 Patient stopped by today after her appointment with [facility] asking to speak to our clinical staff about Induction. She's expecting a call to further discuss.

## 2024-09-05 NOTE — Procedures (Signed)
 Chelsea Herman 2006-05-06 [redacted]w[redacted]d  Fetus A Non-Stress Test Interpretation for 09/05/24  Indication: Velamentous cord insertion - NST only  Fetal Heart Rate A Mode: External Baseline Rate (A): 145 bpm Variability: Moderate Accelerations: 15 x 15 Decelerations: None Multiple birth?: No  Uterine Activity Mode: Palpation, Toco Contraction Frequency (min): irritability noted - not palpable Resting Tone Palpated: Relaxed  Interpretation (Fetal Testing) Nonstress Test Interpretation: Reactive Comments: Reviewed with Dr. Arna

## 2024-09-12 ENCOUNTER — Telehealth: Payer: Self-pay | Admitting: Family Medicine

## 2024-09-12 ENCOUNTER — Ambulatory Visit

## 2024-09-12 NOTE — Telephone Encounter (Signed)
 Good afternoon,   This patient stopped by our office today hoping to speak to one of our clinical staff about the possibility of being induced. She wants to receive a call as soon as possible to address some concerns.

## 2024-09-13 ENCOUNTER — Inpatient Hospital Stay (HOSPITAL_COMMUNITY)
Admission: AD | Admit: 2024-09-13 | Discharge: 2024-09-13 | Disposition: A | Discharge status: Home / Self Care | Attending: Obstetrics & Gynecology | Admitting: Obstetrics & Gynecology

## 2024-09-13 ENCOUNTER — Encounter (HOSPITAL_COMMUNITY): Payer: Self-pay | Admitting: Obstetrics & Gynecology

## 2024-09-13 ENCOUNTER — Inpatient Hospital Stay (HOSPITAL_COMMUNITY)

## 2024-09-13 DIAGNOSIS — R0602 Shortness of breath: Secondary | ICD-10-CM | POA: Diagnosis not present

## 2024-09-13 DIAGNOSIS — O99513 Diseases of the respiratory system complicating pregnancy, third trimester: Secondary | ICD-10-CM | POA: Diagnosis present

## 2024-09-13 DIAGNOSIS — O99343 Other mental disorders complicating pregnancy, third trimester: Secondary | ICD-10-CM | POA: Diagnosis not present

## 2024-09-13 DIAGNOSIS — Z3A38 38 weeks gestation of pregnancy: Secondary | ICD-10-CM

## 2024-09-13 DIAGNOSIS — O26893 Other specified pregnancy related conditions, third trimester: Secondary | ICD-10-CM

## 2024-09-13 DIAGNOSIS — Z3689 Encounter for other specified antenatal screening: Secondary | ICD-10-CM | POA: Diagnosis not present

## 2024-09-13 DIAGNOSIS — R0789 Other chest pain: Secondary | ICD-10-CM | POA: Diagnosis not present

## 2024-09-13 DIAGNOSIS — F419 Anxiety disorder, unspecified: Secondary | ICD-10-CM

## 2024-09-13 DIAGNOSIS — R0600 Dyspnea, unspecified: Secondary | ICD-10-CM | POA: Diagnosis not present

## 2024-09-13 DIAGNOSIS — O471 False labor at or after 37 completed weeks of gestation: Secondary | ICD-10-CM | POA: Diagnosis not present

## 2024-09-13 LAB — COMPREHENSIVE METABOLIC PANEL WITH GFR
ALT: 12 U/L (ref 0–44)
AST: 28 U/L (ref 15–41)
Albumin: 2.5 g/dL — ABNORMAL LOW (ref 3.5–5.0)
Alkaline Phosphatase: 135 U/L — ABNORMAL HIGH (ref 38–126)
Anion gap: 9 (ref 5–15)
BUN: <5 mg/dL — ABNORMAL LOW (ref 6–20)
CO2: 19 mmol/L — ABNORMAL LOW (ref 22–32)
Calcium: 8.2 mg/dL — ABNORMAL LOW (ref 8.9–10.3)
Chloride: 108 mmol/L (ref 98–111)
Creatinine, Ser: 0.64 mg/dL (ref 0.44–1.00)
GFR, Estimated: >60 mL/min (ref >60)
Glucose, Bld: 84 mg/dL (ref 70–99)
Potassium: 3.7 mmol/L (ref 3.5–5.1)
Sodium: 136 mmol/L (ref 135–145)
Total Bilirubin: 0.3 mg/dL (ref 0.0–1.2)
Total Protein: 5.2 g/dL — ABNORMAL LOW (ref 6.5–8.1)

## 2024-09-13 LAB — MAGNESIUM: Magnesium: 1.4 mg/dL — ABNORMAL LOW (ref 1.7–2.4)

## 2024-09-13 LAB — BRAIN NATRIURETIC PEPTIDE: B Natriuretic Peptide: 75.9 pg/mL (ref 0.0–100.0)

## 2024-09-13 LAB — CBC
HCT: 28.4 % — ABNORMAL LOW (ref 36.0–46.0)
Hemoglobin: 9.1 g/dL — ABNORMAL LOW (ref 12.0–15.0)
MCH: 24.7 pg — ABNORMAL LOW (ref 26.0–34.0)
MCHC: 32 g/dL (ref 30.0–36.0)
MCV: 77 fL — ABNORMAL LOW (ref 80.0–100.0)
Platelets: 200 K/uL (ref 150–400)
RBC: 3.69 MIL/uL — ABNORMAL LOW (ref 3.87–5.11)
RDW: 13.3 % (ref 11.5–15.5)
WBC: 5.3 K/uL (ref 4.0–10.5)
nRBC: 0 % (ref 0.0–0.2)

## 2024-09-13 LAB — TROPONIN I (HIGH SENSITIVITY): Troponin I (High Sensitivity): 5 ng/L (ref <18)

## 2024-09-13 MED ORDER — HYDROXYZINE HCL 50 MG PO TABS
50.0000 mg | ORAL_TABLET | Freq: Once | ORAL | Status: AC
  Administered 2024-09-13: 50 mg via ORAL
  Filled 2024-09-13: qty 1

## 2024-09-13 MED ORDER — ALBUTEROL SULFATE HFA 108 (90 BASE) MCG/ACT IN AERS: 1.0000 | INHALATION_SPRAY | Freq: Four times a day (QID) | RESPIRATORY_TRACT | 0 refills | Status: AC | PRN

## 2024-09-13 MED ORDER — HYDROXYZINE HCL 25 MG PO TABS: 25.0000 mg | ORAL_TABLET | Freq: Four times a day (QID) | ORAL | 0 refills | Status: AC | PRN

## 2024-09-13 NOTE — Progress Notes (Signed)
 Difficult to monitor pt with her on her L side and legs drawn up to her abdomen

## 2024-09-13 NOTE — MAU Note (Signed)
 Pharmacy called about Atarax  and stated it should be tubed soon

## 2024-09-13 NOTE — MAU Note (Addendum)
 Chelsea Herman is a 18 y.o. at [redacted]w[redacted]d here in MAU reporting having some chest pressure and SOB for 3 days but worse Thurs. Hx asthma as a child. Some abdominal pain in lower abd and pelvic pressure.  Good FM and denies LOF or VB. Dr Magali in Triage to see pt  LMP: na Onset of complaint: several days Pain score: 7 Vitals:   09/13/24 0413  Pulse: 97  Resp: 18  Temp: 97.7 F (36.5 C)  SpO2: 100%     FHT: 154  Lab orders placed from triage: none

## 2024-09-13 NOTE — MAU Provider Note (Addendum)
 History     [id number]  Arrival date and time: 09/13/24 0406    Chief Complaint  Patient presents with   Shortness of Breath   Chest Pain     HPI Chelsea Herman is a 18 y.o. at [redacted]w[redacted]d by [facility], who presents for shortness of breath and chest pain. She states that it started early in the morning. She has a remote history of asthma but has not used an inhaler in some time. She states that she is wheezing. She keeps inquiring on when she can be induced. It is explained to the patient that it was recommended by MFM to induce at 39 weeks. She then asks if she can have a C-section for personal reasons. Explained that c-section is only done for fetal or maternal indications.      A/Positive/-- (02/26 0932)  Past Medical History:  Diagnosis Date   Asthma    as a child-no current medications or treatments   Gestational diabetes 2024   History of gestational hypertension 02/04/2021   Hypertension    Migraines     Past Surgical History:  Procedure Laterality Date   NO PAST SURGERIES      Family History  Problem Relation Age of Onset   Hearing loss Mother    Asthma Neg Hx    Cancer Neg Hx    Diabetes Neg Hx    Stroke Neg Hx     Social History   Socioeconomic History   Marital status: Single    Spouse name: Not on file   Number of children: Not on file   Years of education: Not on file   Highest education level: Not on file  Occupational History   Not on file  Tobacco Use   Smoking status: Never    Passive exposure: Never   Smokeless tobacco: Never  Vaping Use   Vaping status: Never Used  Substance and Sexual Activity   Alcohol use: Never   Drug use: Never   Sexual activity: Not Currently    Partners: Male    Birth control/protection: None  Other Topics Concern   Not on file  Social History Narrative   ** Merged History Encounter **       Social Drivers of Health   Financial Resource Strain: Not on file  Food Insecurity: Food Insecurity Present (09/02/2024)    Hunger Vital Sign    Worried About Running Out of Food in the Last Year: Often true    Ran Out of Food in the Last Year: Often true  Transportation Needs: No Transportation Needs (09/02/2024)   PRAPARE - Administrator, Civil Service (Medical): No    Lack of Transportation (Non-Medical): No  Physical Activity: Not on file  Stress: Not on file  Social Connections: Not on file  Intimate Partner Violence: Not At Risk (09/02/2024)   Humiliation, Afraid, Rape, and Kick questionnaire    Fear of Current or Ex-Partner: No    Emotionally Abused: No    Physically Abused: No    Sexually Abused: No    Allergies  Allergen Reactions   Peanut-Containing Drug Products Anaphylaxis   Citric Acid Rash    No current facility-administered medications on file prior to encounter.   Current Outpatient Medications on File Prior to Encounter  Medication Sig Dispense Refill   oxyCODONE -acetaminophen  (PERCOCET) 5-325 MG tablet Take 1-2 tablets by mouth every 4 (four) hours as needed for severe pain (pain score 7-10). (Patient not taking: Reported on 08/29/2024) 15 tablet 0  Prenatal Vit-Fe Fumarate-FA (PREPLUS) 27-1 MG TABS Take 1 tablet by mouth daily. 30 tablet 13   acetaminophen  (TYLENOL ) 500 MG tablet Take 2 tablets (1,000 mg total) by mouth every 6 (six) hours as needed for mild pain (pain score 1-3). 60 tablet 2   amoxicillin -clavulanate (AUGMENTIN ) 875-125 MG tablet Take 1 tablet by mouth 2 (two) times daily. 14 tablet 0   aspirin  EC 81 MG tablet Take 1 tablet (81 mg total) by mouth daily. Swallow whole. 30 tablet 12   Blood Pressure Monitoring (BLOOD PRESSURE KIT) DEVI 1 Device by Does not apply route as needed. 1 each 0   cyclobenzaprine  (FLEXERIL ) 10 MG tablet Take 1 tablet (10 mg total) by mouth 2 (two) times daily as needed for muscle spasms. (Patient not taking: Reported on 09/13/2024) 20 tablet 1   metoCLOPramide  (REGLAN ) 10 MG tablet Take 1 tablet (10 mg total) by mouth every 6 (six)  hours. (Patient not taking: Reported on 09/13/2024) 30 tablet 0   ondansetron  (ZOFRAN -ODT) 4 MG disintegrating tablet Take 1 tablet (4 mg total) by mouth every 6 (six) hours as needed for nausea or vomiting. (Patient not taking: Reported on 09/13/2024) 20 tablet 1   oxyCODONE  (ROXICODONE ) 5 MG immediate release tablet Take 1-2 tablets (5-10 mg total) by mouth every 6 (six) hours as needed for severe pain (pain score 7-10). (Patient not taking: Reported on 09/13/2024) 15 tablet 0   oxyCODONE -acetaminophen  (PERCOCET/ROXICET) 5-325 MG tablet Take 1 tablet by mouth every 6 (six) hours as needed for severe pain (pain score 7-10). (Patient not taking: Reported on 09/13/2024) 5 tablet 0   potassium chloride  SA (KLOR-CON  M) 20 MEQ tablet Take 2 tablets (40 mEq total) by mouth 2 (two) times daily. (Patient not taking: Reported on 09/13/2024) 10 tablet 0   [DISCONTINUED] albuterol  (PROVENTIL  HFA;VENTOLIN  HFA) 108 (90 Base) MCG/ACT inhaler Inhale 2 puffs into the lungs every 4 (four) hours as needed for wheezing or shortness of breath. 1 Inhaler 1   [DISCONTINUED] cetirizine  (ZYRTEC ) 5 MG tablet Take 1 tablet (5 mg total) by mouth daily. 7 tablet 0    Pertinent positives and negative per HPI, all others reviewed and negative  Physical Exam   BP 122/60   Pulse 78   Temp 97.7 F (36.5 C)   Resp 18   Ht 5' 5 (1.651 m)   Wt 89.4 kg   LMP 12/21/2023   SpO2 100%   BMI 32.78 kg/m   Patient Vitals for the past 24 hrs:  BP Temp Pulse Resp SpO2 Height Weight  09/13/24 0845 122/60 -- 78 -- -- -- --  09/13/24 0434 126/88 -- 90 -- -- -- --  09/13/24 0413 -- 97.7 F (36.5 C) 97 18 100 % 5' 5 (1.651 m) 89.4 kg    Physical Exam Vitals and nursing note reviewed.  Constitutional:      Appearance: She is well-developed.  HENT:     Head: Normocephalic and atraumatic.     Mouth/Throat:     Mouth: Mucous membranes are moist.  Eyes:     Extraocular Movements: Extraocular movements intact.  Cardiovascular:      Rate and Rhythm: Normal rate and regular rhythm.  Pulmonary:     Effort: Pulmonary effort is normal.  Abdominal:     Palpations: Abdomen is soft.     Tenderness: There is no abdominal tenderness.  Skin:    Capillary Refill: Capillary refill takes less than 2 seconds.  Neurological:     General: No focal  deficit present.     Mental Status: She is alert.      Cervical Exam Dilation: 3 Effacement (%): 20 Station: -3 Presentation: Vertex Exam by:: Dr Magali EHRICH Baseline: 140 bpm Variability: Good {> 6 bpm) Accelerations: Reactive Decelerations: Absent Uterine activity: Frequency: Every 10 minutes irregularly   Labs Results for orders placed or performed during the hospital encounter of 09/13/24 (from the past 24 hours)  CBC     Status: Abnormal   Collection Time: 09/13/24  6:57 AM  Result Value Ref Range   WBC 5.3 4.0 - 10.5 K/uL   RBC 3.69 (L) 3.87 - 5.11 MIL/uL   Hemoglobin 9.1 (L) 12.0 - 15.0 g/dL   HCT 71.5 (L) 63.9 - 53.9 %   MCV 77.0 (L) 80.0 - 100.0 fL   MCH 24.7 (L) 26.0 - 34.0 pg   MCHC 32.0 30.0 - 36.0 g/dL   RDW 86.6 88.4 - 84.4 %   Platelets 200 150 - 400 K/uL   nRBC 0.0 0.0 - 0.2 %  Comprehensive metabolic panel     Status: Abnormal   Collection Time: 09/13/24  6:57 AM  Result Value Ref Range   Sodium 136 135 - 145 mmol/L   Potassium 3.7 3.5 - 5.1 mmol/L   Chloride 108 98 - 111 mmol/L   CO2 19 (L) 22 - 32 mmol/L   Glucose, Bld 84 70 - 99 mg/dL   BUN <5 (L) 6 - 20 mg/dL   Creatinine, Ser 9.35 0.44 - 1.00 mg/dL   Calcium 8.2 (L) 8.9 - 10.3 mg/dL   Total Protein 5.2 (L) 6.5 - 8.1 g/dL   Albumin 2.5 (L) 3.5 - 5.0 g/dL   AST 28 15 - 41 U/L   ALT 12 0 - 44 U/L   Alkaline Phosphatase 135 (H) 38 - 126 U/L   Total Bilirubin 0.3 0.0 - 1.2 mg/dL   GFR, Estimated >39 >39 mL/min   Anion gap 9 5 - 15  Magnesium      Status: Abnormal   Collection Time: 09/13/24  6:57 AM  Result Value Ref Range   Magnesium  1.4 (L) 1.7 - 2.4 mg/dL  Troponin I (High  Sensitivity)     Status: None   Collection Time: 09/13/24  7:02 AM  Result Value Ref Range   Troponin I (High Sensitivity) 5 <18 ng/L  Brain natriuretic peptide     Status: None   Collection Time: 09/13/24  7:02 AM  Result Value Ref Range   B Natriuretic Peptide 75.9 0.0 - 100.0 pg/mL    Imaging DG CHEST PORT 1 VIEW Result Date: 09/13/2024 CLINICAL DATA:  10026.  Left chest pain. EXAM: PORTABLE CHEST 1 VIEW COMPARISON:  PA Lat chest 11/01/2010. FINDINGS: The heart size and mediastinal contours are within normal limits. There is increased hazy opacity in the lateral left lower lung field, which could be due to an epicardial fat pad, atelectasis or pneumonia. The lungs are otherwise clear. A follow-up study is recommended with PA and lateral views to see if this persists. The sulci are sharp. The visualized skeletal structures are unremarkable. IMPRESSION: Increased hazy opacity in the lateral left lower lung field, which could be due to an epicardial fat pad, atelectasis or pneumonia. A follow-up study is recommended with PA and lateral views to see if this persists. Electronically Signed   By: Francis Quam M.D.   On: 09/13/2024 07:01    MAU Course  Procedures Lab Orders  CBC         Comprehensive metabolic panel         Magnesium          Brain natriuretic peptide    Meds ordered this encounter  Medications   hydrOXYzine  (ATARAX ) tablet 50 mg   hydrOXYzine  (ATARAX ) 25 MG tablet    Sig: Take 1 tablet (25 mg total) by mouth every 6 (six) hours as needed.    Dispense:  12 tablet    Refill:  0    Supervising Provider:   PRATT, TANYA S [[id number]]   Imaging Orders         DG CHEST PORT 1 VIEW     MDM Moderate (Level 3-4)  Assessment and Plan    #FWB NST: Reactive  Chelsea Herman is a 18 y.o. at [redacted]w[redacted]d by [facility], who presents for shortness of breath and chest pain.  -Complains of chest pain and shortness of breath with wheezing. Remote hx of asthma but no inhaler use in some  time.  -Patient's lungs are CTABL with oxygen saturation at 100% on RA -CXR interpreted as epicardial fat pad vs atelectasis vs pneumonia. Patient remains afebrile with normal heart rate and normotensive -ECG shows sinus tachycardia with HR of 105.  -CBC, CMP, mag, trop and BNP pending.   -Stable for discharge home  ----------------------------------------- I resumed care of this patient at 0800 Labs reviewed : NM  No evidence of acute pathology at this time likely normal physiological changes in pregnancy  NST Cat 1 reactive No evidence of active labor  Plan for discharge   ASSESSMENT 1. Dyspnea, unspecified type (Primary)  2. NST (non-stress test) reactive on fetal surveillance  3. [redacted] weeks gestation of pregnancy  4. Anxiety     PLAN  Future Appointments  Date Time Provider Department Center  09/16/2024  9:15 AM Milly Olam DELENA EDDY [facility]  09/19/2024  1:15 PM [facility] NST [facility]    Discharge from MAU in stable condition  See AVS for full description of educational information and instructions provided to the patient at time of discharge  Warning signs for worsening condition that would warrant emergency follow-up discussed Patient may return to MAU as needed   ------------------------------------------- Olam Dalton, MSN, [facility], Center for [facility]

## 2024-09-14 ENCOUNTER — Inpatient Hospital Stay (HOSPITAL_COMMUNITY)
Admission: AD | Admit: 2024-09-14 | Discharge: 2024-09-14 | Disposition: A | Discharge status: Home / Self Care | Payer: Self-pay | Attending: Family Medicine | Admitting: Family Medicine

## 2024-09-14 ENCOUNTER — Other Ambulatory Visit: Payer: Self-pay

## 2024-09-14 ENCOUNTER — Encounter (HOSPITAL_COMMUNITY): Payer: Self-pay | Admitting: Family Medicine

## 2024-09-14 DIAGNOSIS — Z3A38 38 weeks gestation of pregnancy: Secondary | ICD-10-CM | POA: Diagnosis not present

## 2024-09-14 DIAGNOSIS — Z3689 Encounter for other specified antenatal screening: Secondary | ICD-10-CM

## 2024-09-14 DIAGNOSIS — O471 False labor at or after 37 completed weeks of gestation: Secondary | ICD-10-CM

## 2024-09-14 DIAGNOSIS — O0990 Supervision of high risk pregnancy, unspecified, unspecified trimester: Secondary | ICD-10-CM

## 2024-09-14 DIAGNOSIS — O209 Hemorrhage in early pregnancy, unspecified: Secondary | ICD-10-CM

## 2024-09-14 NOTE — MAU Provider Note (Signed)
 S: [name] is a 18 y.o. [phone] at [redacted]w[redacted]d  who presents to MAU today for labor evaluation.   Nurse reports patient remains unchanged after 1.5 hours and requests discharge.   Cervical exam by RN:  Dilation: 3 Effacement (%): 60 Station: -2 Presentation: Vertex Exam by:: D.Callaway, RN  Fetal Monitoring: Baseline: 145 Variability: Moderate Accelerations: 15x15 Decelerations: Negative Contractions: None graphed  MDM Discussed patient with RN. NST reviewed.   A: SIUP at [redacted]w[redacted]d  False labor Cat I FT  P: Discharge home Patient to follow-up with primary office as scheduled  Patient may return to MAU as needed or when in labor   Synthia Raisin, [place] 09/14/2024 7:02 AM

## 2024-09-14 NOTE — MAU Note (Signed)
 Chelsea Herman is a 18 y.o. at [redacted]w[redacted]d here in MAU reporting: contractions starting overnight while sleeping; woke her up from her sleep. Patient states irregular since waking up, but feeling a lot of rectal pressure. Arrival via EMS. Care at [facility].  LMP: 12/21/2023 Onset of complaint: 09/14/24 Pain score: 7/10 Vitals:   09/14/24 0529  BP: 139/83  Pulse: 70  Resp: 16  Temp: 97.8 F (36.6 C)  FHT: 145bpm Lab orders placed from triage: Geneticist, molecular

## 2024-09-16 ENCOUNTER — Inpatient Hospital Stay (HOSPITAL_COMMUNITY): Admission: AD | Admit: 2024-09-16 | Discharge: 2024-09-16 | Disposition: A | Discharge status: Home / Self Care

## 2024-09-16 ENCOUNTER — Ambulatory Visit: Payer: Self-pay | Admitting: Advanced Practice Midwife

## 2024-09-16 ENCOUNTER — Encounter (HOSPITAL_COMMUNITY): Payer: Self-pay | Admitting: Obstetrics and Gynecology

## 2024-09-16 ENCOUNTER — Other Ambulatory Visit: Payer: Self-pay

## 2024-09-16 ENCOUNTER — Encounter: Payer: Self-pay | Admitting: Advanced Practice Midwife

## 2024-09-16 VITALS — BP 124/82 | HR 71 | Wt 193.6 lb

## 2024-09-16 DIAGNOSIS — Z3A38 38 weeks gestation of pregnancy: Secondary | ICD-10-CM | POA: Diagnosis not present

## 2024-09-16 DIAGNOSIS — O43123 Velamentous insertion of umbilical cord, third trimester: Secondary | ICD-10-CM | POA: Diagnosis not present

## 2024-09-16 DIAGNOSIS — M549 Dorsalgia, unspecified: Secondary | ICD-10-CM

## 2024-09-16 DIAGNOSIS — F419 Anxiety disorder, unspecified: Secondary | ICD-10-CM | POA: Diagnosis not present

## 2024-09-16 DIAGNOSIS — O0990 Supervision of high risk pregnancy, unspecified, unspecified trimester: Secondary | ICD-10-CM

## 2024-09-16 DIAGNOSIS — O0993 Supervision of high risk pregnancy, unspecified, third trimester: Secondary | ICD-10-CM

## 2024-09-16 DIAGNOSIS — R109 Unspecified abdominal pain: Secondary | ICD-10-CM | POA: Diagnosis present

## 2024-09-16 DIAGNOSIS — A6009 Herpesviral infection of other urogenital tract: Secondary | ICD-10-CM

## 2024-09-16 DIAGNOSIS — O99891 Other specified diseases and conditions complicating pregnancy: Secondary | ICD-10-CM | POA: Diagnosis not present

## 2024-09-16 DIAGNOSIS — O471 False labor at or after 37 completed weeks of gestation: Secondary | ICD-10-CM | POA: Insufficient documentation

## 2024-09-16 DIAGNOSIS — O98313 Other infections with a predominantly sexual mode of transmission complicating pregnancy, third trimester: Secondary | ICD-10-CM

## 2024-09-16 DIAGNOSIS — R0600 Dyspnea, unspecified: Secondary | ICD-10-CM | POA: Diagnosis not present

## 2024-09-16 NOTE — Progress Notes (Signed)
   PRENATAL VISIT NOTE  Subjective:  Chelsea Herman is a 18 y.o. [id number] at [redacted]w[redacted]d being seen today for ongoing prenatal care.  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  Patient reports she has an HSV outbreak that is painful on her left labia.  Contractions: Irritability. Vag. Bleeding: None.  Movement: Present. Denies leaking of fluid.   The following portions of the patient's history were reviewed and updated as appropriate: allergies, current medications, past family history, past medical history, past social history, past surgical history and problem list.   Objective:    Vitals:   09/16/24 0935  BP: 124/82  Pulse: 71  Weight: 193 lb 9.6 oz (87.8 kg)    Fetal Status:  Fetal Heart Rate (bpm): 137 Fundal Height: 38 cm Movement: Present    General: Alert, oriented and cooperative. Patient is in no acute distress.  Skin: Skin is warm and dry. No rash noted.   Cardiovascular: Normal heart rate noted  Respiratory: Normal respiratory effort, no problems with respiration noted  Abdomen: Soft, gravid, appropriate for gestational age.  Pain/Pressure: Present     Pelvic: Cervical exam performed in the presence of a chaperone Dilation: 4 Effacement (%): 60 Station: -1  Extremities: Normal range of motion.  Edema: Trace (hands and feet sometimes)  Mental Status: Normal mood and affect. Normal behavior. Normal judgment and thought content.   Assessment and Plan:  Pregnancy: [id number] at [redacted]w[redacted]d 1. Supervision of high risk pregnancy, antepartum (Primary) --Anticipatory guidance about next  visits/weeks of pregnancy given.   2. 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 Dyspnea, unspecified type --Evaluated in MAU on 9/19.    4. Anxiety Concerns about velamentous cord, was planning to request primary cesarean before 39 weeks, had sent MyChart message to provider.  See HSV   5. [redacted] weeks gestation of pregnancy   6. Maternal genital herpes, third trimester --Pt has small lesion on left labia minora that is painful to touch by cotton swab today.  Hx recurrent HSV.  Pt is NOT taking Valtrex .   --Discussed risks with patient,  lower due to recurrent HSV but unsure how long until patient delivers given contractions and advanced dilation.   --Pt would prefer primary cesarean.  No indication for delivery prior to 39 weeks.  Cesarean scheduled at 39 weeks on 02/20/24.   - Ambulatory Referral For Surgery Scheduling   Term labor symptoms and general obstetric precautions including but not limited to vaginal bleeding, contractions, leaking of fluid and fetal movement were reviewed in detail with the patient. Please refer to After Visit Summary for other counseling recommendations.   No follow-ups on file.  Future Appointments  Date Time Provider Department Center  09/19/2024  1:15 PM [facility] NST [facility]  09/23/2024  3:15 PM Cresenzo, Norleen GAILS, MD [facility]    Olam Boards, CNM

## 2024-09-16 NOTE — MAU Note (Cosign Needed)
 Maternal Assessment Unit Provider Note--Labor check  Subjective: [name] is a 18 y.o. [phone] pregnant female at [redacted]w[redacted]d who presents to MAU today with complaint of loss of mucous plug, back pain, pink spotting.   Receives care at [facility]. Cervical exam in the office today 4cm. Unchanged in the MAU.  Objective: BP 134/84 (BP Location: Right Arm)   Pulse 70   Temp 98.8 F (37.1 C) (Oral)   Resp 17   Ht 5' 5 (1.651 m)   Wt 88.4 kg   LMP 12/21/2023   SpO2 98%   BMI 32.42 kg/m    Time: 2108  FHT: baseline 140 bpm, moderate variability, + accelerations, no decelerations,   Contractions: q 3-6 mins   Assessment/Plan Discharge from MAU in stable condition with term labor precautions   Allergies as of 09/16/2024       Reactions   Peanut-containing Drug Products Anaphylaxis   Citric Acid Rash        Medication List     TAKE these medications    acetaminophen  500 MG tablet Commonly known as: TYLENOL  Take 2 tablets (1,000 mg total) by mouth every 6 (six) hours as needed for mild pain (pain score 1-3).   albuterol  108 (90 Base) MCG/ACT inhaler Commonly known as: VENTOLIN  HFA Inhale 1-2 puffs into the lungs every 6 (six) hours as needed for wheezing or shortness of breath.   aspirin  EC 81 MG tablet Take 1 tablet (81 mg total) by mouth daily. Swallow whole.   Blood Pressure Kit Devi 1 Device by Does not apply route as needed.   cyclobenzaprine  10 MG tablet Commonly known as: FLEXERIL  Take 1 tablet (10 mg total) by mouth 2 (two) times daily as needed for muscle spasms.   hydrOXYzine  25 MG tablet Commonly known as: ATARAX  Take 1 tablet (25 mg total) by mouth every 6 (six) hours as needed.   metoCLOPramide  10 MG tablet Commonly known as: REGLAN  Take 1 tablet (10 mg total) by mouth every 6 (six) hours.   potassium chloride  SA 20 MEQ tablet Commonly known as: KLOR-CON  M Take 2 tablets (40 mEq total) by mouth 2 (two) times daily.    PrePLUS 27-1 MG Tabs Take 1 tablet by mouth daily.        Trudy Leeroy NOVAK, MD 09/16/2024 9:09 PM

## 2024-09-16 NOTE — MAU Note (Signed)
 Chelsea Herman is a 18 y.o. at [redacted]w[redacted]d here in MAU reporting: lost mucous plug then started cramping. Also having pressure and lower back pain. Reports noticing some pink spotting but denies VB or LOF. Had VE in the office and was 4 cm. +FM. Has current HSV outbreak - got prescription for valtrex  today but has not started. Plan for c/s.   LMP: NA Onset of complaint: 1800 Pain score: 7 - abdomen; 7 - back; 8 - pelvic Vitals:   09/16/24 2004  BP: 121/77  Pulse: (!) 105  Resp: 18  Temp: 98.8 F (37.1 C)  SpO2: 98%     FHT: 145  Lab orders placed from triage: labor eval

## 2024-09-17 ENCOUNTER — Telehealth (HOSPITAL_COMMUNITY): Payer: Self-pay | Admitting: *Deleted

## 2024-09-17 NOTE — Telephone Encounter (Signed)
 Preadmission screen

## 2024-09-17 NOTE — Anesthesia Preprocedure Evaluation (Signed)
 Anesthesia Evaluation  Patient identified by MRN, date of birth, ID band Patient awake    Reviewed: Allergy & Precautions, NPO status , Patient's Chart, lab work & pertinent test results  History of Anesthesia Complications Negative for: history of anesthetic complications  Airway Mallampati: II  TM Distance: >3 FB Neck ROM: Full    Dental  (+) Missing,    Pulmonary asthma    Pulmonary exam normal        Cardiovascular negative cardio ROS Normal cardiovascular exam     Neuro/Psych  Headaches    GI/Hepatic negative GI ROS, Neg liver ROS,,,  Endo/Other  negative endocrine ROS    Renal/GU negative Renal ROS     Musculoskeletal negative musculoskeletal ROS (+)    Abdominal   Peds  Hematology  (+) Blood dyscrasia (Hgb 10.3), anemia   Anesthesia Other Findings HSV in 3rd trimester  Reproductive/Obstetrics (+) Pregnancy                              Anesthesia Physical Anesthesia Plan  ASA: 2  Anesthesia Plan: Spinal   Post-op Pain Management: Ofirmev  IV (intra-op)*   Induction:   PONV Risk Score and Plan: 4 or greater and Treatment may vary due to age or medical condition, Ondansetron  and Dexamethasone   Airway Management Planned: Natural Airway  Additional Equipment: None  Intra-op Plan:   Post-operative Plan:   Informed Consent: I have reviewed the patients History and Physical, chart, labs and discussed the procedure including the risks, benefits and alternatives for the proposed anesthesia with the patient or authorized representative who has indicated his/her understanding and acceptance.       Plan Discussed with: CRNA  Anesthesia Plan Comments:          Anesthesia Quick Evaluation

## 2024-09-17 NOTE — Patient Instructions (Signed)
 Chelsea Herman  09/17/2024   Your procedure is scheduled on:  09/20/2024  Arrive at 1015 at Entrance C on [address] at [facility]  and [employer]. You are invited to use the FREE valet parking or use the Visitor's parking deck.  Pick up the phone at the desk and dial [phone].  Call this number if you have problems the morning of surgery: [phone]  Remember:   Do not eat food:(After Midnight) Desps de medianoche.  You may drink clear liquids until  _____.  Clear liquids means a liquid you can see thru.  It can have color such as Cola or Kool aid.  Tea is OK and coffee as long as no milk or creamer of any kind.  Take these medicines the morning of surgery with A SIP OF WATER :  Bring your inhaler with you   Do not wear jewelry, make-up or nail polish.  Do not wear lotions, powders, or perfumes. Do not wear deodorant.  Do not shave 48 hours prior to surgery.  Do not bring valuables to the hospital.  [facility] is not   responsible for any belongings or valuables brought to the hospital.  Contacts, dentures or bridgework may not be worn into surgery.  Leave suitcase in the car. After surgery it may be brought to your room.  For patients admitted to the hospital, checkout time is 11:00 AM the day of              discharge.      Please read over the following fact sheets that you were given:     Preparing for Surgery

## 2024-09-18 ENCOUNTER — Encounter (HOSPITAL_COMMUNITY)
Admission: RE | Admit: 2024-09-18 | Discharge: 2024-09-18 | Disposition: A | Discharge status: Home / Self Care | Source: Ambulatory Visit | Attending: Obstetrics and Gynecology | Admitting: Obstetrics and Gynecology

## 2024-09-18 ENCOUNTER — Encounter (HOSPITAL_COMMUNITY): Payer: Self-pay

## 2024-09-18 VITALS — Ht 65.0 in | Wt 197.0 lb

## 2024-09-18 DIAGNOSIS — A6004 Herpesviral vulvovaginitis: Secondary | ICD-10-CM | POA: Diagnosis not present

## 2024-09-18 DIAGNOSIS — O98313 Other infections with a predominantly sexual mode of transmission complicating pregnancy, third trimester: Secondary | ICD-10-CM | POA: Insufficient documentation

## 2024-09-18 DIAGNOSIS — O0993 Supervision of high risk pregnancy, unspecified, third trimester: Secondary | ICD-10-CM | POA: Insufficient documentation

## 2024-09-18 DIAGNOSIS — O0990 Supervision of high risk pregnancy, unspecified, unspecified trimester: Secondary | ICD-10-CM | POA: Diagnosis not present

## 2024-09-18 DIAGNOSIS — Z3A38 38 weeks gestation of pregnancy: Secondary | ICD-10-CM | POA: Diagnosis not present

## 2024-09-18 DIAGNOSIS — Z01818 Encounter for other preprocedural examination: Secondary | ICD-10-CM | POA: Diagnosis present

## 2024-09-18 DIAGNOSIS — Z01812 Encounter for preprocedural laboratory examination: Secondary | ICD-10-CM | POA: Insufficient documentation

## 2024-09-18 DIAGNOSIS — B009 Herpesviral infection, unspecified: Secondary | ICD-10-CM

## 2024-09-18 HISTORY — DX: Cardiac arrhythmia, unspecified: I49.9

## 2024-09-18 LAB — COMPREHENSIVE METABOLIC PANEL WITH GFR
ALT: 10 U/L (ref 0–44)
AST: 21 U/L (ref 15–41)
Albumin: 2.9 g/dL — ABNORMAL LOW (ref 3.5–5.0)
Alkaline Phosphatase: 160 U/L — ABNORMAL HIGH (ref 38–126)
Anion gap: 11 (ref 5–15)
BUN: 6 mg/dL (ref 6–20)
CO2: 18 mmol/L — ABNORMAL LOW (ref 22–32)
Calcium: 8.9 mg/dL (ref 8.9–10.3)
Chloride: 107 mmol/L (ref 98–111)
Creatinine, Ser: 0.68 mg/dL (ref 0.44–1.00)
GFR, Estimated: >60 mL/min (ref >60)
Glucose, Bld: 95 mg/dL (ref 70–99)
Potassium: 3.9 mmol/L (ref 3.5–5.1)
Sodium: 136 mmol/L (ref 135–145)
Total Bilirubin: 0.5 mg/dL (ref 0.0–1.2)
Total Protein: 6 g/dL — ABNORMAL LOW (ref 6.5–8.1)

## 2024-09-18 LAB — TYPE AND SCREEN
ABO/RH(D): A POS
Antibody Screen: NEGATIVE

## 2024-09-18 LAB — RPR: RPR Ser Ql: NONREACTIVE

## 2024-09-18 LAB — CBC
HCT: 32.2 % — ABNORMAL LOW (ref 36.0–46.0)
Hemoglobin: 10.3 g/dL — ABNORMAL LOW (ref 12.0–15.0)
MCH: 24.8 pg — ABNORMAL LOW (ref 26.0–34.0)
MCHC: 32 g/dL (ref 30.0–36.0)
MCV: 77.6 fL — ABNORMAL LOW (ref 80.0–100.0)
Platelets: 202 K/uL (ref 150–400)
RBC: 4.15 MIL/uL (ref 3.87–5.11)
RDW: 13.6 % (ref 11.5–15.5)
WBC: 5.2 K/uL (ref 4.0–10.5)
nRBC: 0 % (ref 0.0–0.2)

## 2024-09-19 ENCOUNTER — Ambulatory Visit

## 2024-09-20 ENCOUNTER — Inpatient Hospital Stay (HOSPITAL_COMMUNITY): Payer: Self-pay | Admitting: Anesthesiology

## 2024-09-20 ENCOUNTER — Other Ambulatory Visit: Payer: Self-pay

## 2024-09-20 ENCOUNTER — Inpatient Hospital Stay (HOSPITAL_COMMUNITY)
Admission: AD | Admit: 2024-09-20 | Discharge: 2024-09-23 | DRG: 787 | Disposition: A | Discharge status: Home / Self Care | Attending: Family Medicine | Admitting: Family Medicine

## 2024-09-20 ENCOUNTER — Inpatient Hospital Stay (HOSPITAL_COMMUNITY): Admission: RE | Admit: 2024-09-20 | Payer: Self-pay | Source: Home / Self Care | Admitting: Obstetrics and Gynecology

## 2024-09-20 ENCOUNTER — Encounter (HOSPITAL_COMMUNITY)
Admission: AD | Disposition: A | Discharge status: Home / Self Care | Payer: Self-pay | Source: Home / Self Care | Attending: Family Medicine

## 2024-09-20 ENCOUNTER — Encounter (HOSPITAL_COMMUNITY): Payer: Self-pay | Admitting: Obstetrics & Gynecology

## 2024-09-20 DIAGNOSIS — O9081 Anemia of the puerperium: Secondary | ICD-10-CM | POA: Diagnosis not present

## 2024-09-20 DIAGNOSIS — D62 Acute posthemorrhagic anemia: Secondary | ICD-10-CM | POA: Diagnosis not present

## 2024-09-20 DIAGNOSIS — A6000 Herpesviral infection of urogenital system, unspecified: Secondary | ICD-10-CM | POA: Diagnosis present

## 2024-09-20 DIAGNOSIS — O4292 Full-term premature rupture of membranes, unspecified as to length of time between rupture and onset of labor: Secondary | ICD-10-CM | POA: Diagnosis present

## 2024-09-20 DIAGNOSIS — A6009 Herpesviral infection of other urogenital tract: Secondary | ICD-10-CM

## 2024-09-20 DIAGNOSIS — O43123 Velamentous insertion of umbilical cord, third trimester: Secondary | ICD-10-CM | POA: Diagnosis present

## 2024-09-20 DIAGNOSIS — O34211 Maternal care for low transverse scar from previous cesarean delivery: Secondary | ICD-10-CM | POA: Diagnosis present

## 2024-09-20 DIAGNOSIS — O98313 Other infections with a predominantly sexual mode of transmission complicating pregnancy, third trimester: Secondary | ICD-10-CM

## 2024-09-20 DIAGNOSIS — Z98891 History of uterine scar from previous surgery: Principal | ICD-10-CM

## 2024-09-20 DIAGNOSIS — J4520 Mild intermittent asthma, uncomplicated: Secondary | ICD-10-CM | POA: Diagnosis present

## 2024-09-20 DIAGNOSIS — Z59819 Housing instability, housed unspecified: Secondary | ICD-10-CM

## 2024-09-20 DIAGNOSIS — Z3A39 39 weeks gestation of pregnancy: Secondary | ICD-10-CM

## 2024-09-20 DIAGNOSIS — O98319 Other infections with a predominantly sexual mode of transmission complicating pregnancy, unspecified trimester: Secondary | ICD-10-CM | POA: Diagnosis present

## 2024-09-20 DIAGNOSIS — O9832 Other infections with a predominantly sexual mode of transmission complicating childbirth: Principal | ICD-10-CM | POA: Diagnosis present

## 2024-09-20 DIAGNOSIS — Z3490 Encounter for supervision of normal pregnancy, unspecified, unspecified trimester: Principal | ICD-10-CM

## 2024-09-20 DIAGNOSIS — O09613 Supervision of young primigravida, third trimester: Secondary | ICD-10-CM | POA: Diagnosis not present

## 2024-09-20 LAB — CBC
HCT: 28.5 % — ABNORMAL LOW (ref 36.0–46.0)
Hemoglobin: 9.1 g/dL — ABNORMAL LOW (ref 12.0–15.0)
MCH: 24.6 pg — ABNORMAL LOW (ref 26.0–34.0)
MCHC: 31.9 g/dL (ref 30.0–36.0)
MCV: 77 fL — ABNORMAL LOW (ref 80.0–100.0)
Platelets: 172 K/uL (ref 150–400)
RBC: 3.7 MIL/uL — ABNORMAL LOW (ref 3.87–5.11)
RDW: 13.7 % (ref 11.5–15.5)
WBC: 10.8 K/uL — ABNORMAL HIGH (ref 4.0–10.5)
nRBC: 0 % (ref 0.0–0.2)

## 2024-09-20 LAB — CREATININE, SERUM
Creatinine, Ser: 0.77 mg/dL (ref 0.44–1.00)
GFR, Estimated: >60 mL/min (ref >60)

## 2024-09-20 SURGERY — Surgical Case
Anesthesia: Spinal

## 2024-09-20 MED ORDER — CEFAZOLIN SODIUM-DEXTROSE 2-4 GM/100ML-% IV SOLN
2.0000 g | INTRAVENOUS | Status: AC
  Administered 2024-09-20: 2 g via INTRAVENOUS
  Filled 2024-09-20: qty 100

## 2024-09-20 MED ORDER — DIPHENHYDRAMINE HCL 25 MG PO CAPS
25.0000 mg | ORAL_CAPSULE | Freq: Four times a day (QID) | ORAL | Status: DC | PRN
  Administered 2024-09-21: 25 mg via ORAL

## 2024-09-20 MED ORDER — OXYTOCIN-SODIUM CHLORIDE 30-0.9 UT/500ML-% IV SOLN
INTRAVENOUS | Status: DC | PRN
  Administered 2024-09-20: 300 mL via INTRAVENOUS

## 2024-09-20 MED ORDER — FENTANYL CITRATE (PF) 100 MCG/2ML IJ SOLN: 25.0000 ug | INTRAMUSCULAR | Status: DC | PRN

## 2024-09-20 MED ORDER — SENNOSIDES-DOCUSATE SODIUM 8.6-50 MG PO TABS
2.0000 | ORAL_TABLET | Freq: Every day | ORAL | Status: DC
  Administered 2024-09-22 – 2024-09-23 (×2): 2 via ORAL
  Filled 2024-09-20 (×3): qty 2

## 2024-09-20 MED ORDER — SODIUM CHLORIDE 0.9 % IV SOLN
INTRAVENOUS | Status: DC | PRN
  Administered 2024-09-20: 500 mg via INTRAVENOUS

## 2024-09-20 MED ORDER — MORPHINE SULFATE (PF) 0.5 MG/ML IJ SOLN
INTRAMUSCULAR | Status: DC | PRN
  Administered 2024-09-20: 150 ug via INTRATHECAL

## 2024-09-20 MED ORDER — STERILE WATER FOR IRRIGATION IR SOLN
Status: DC | PRN
  Administered 2024-09-20: 1

## 2024-09-20 MED ORDER — KETOROLAC TROMETHAMINE 30 MG/ML IJ SOLN
INTRAMUSCULAR | Status: AC
  Filled 2024-09-20: qty 1

## 2024-09-20 MED ORDER — SODIUM CHLORIDE 0.9 % IR SOLN
Status: DC | PRN
  Administered 2024-09-20: 100 mL

## 2024-09-20 MED ORDER — MORPHINE SULFATE (PF) 0.5 MG/ML IJ SOLN
INTRAMUSCULAR | Status: AC
  Filled 2024-09-20: qty 10

## 2024-09-20 MED ORDER — DEXAMETHASONE SODIUM PHOSPHATE 10 MG/ML IJ SOLN
INTRAMUSCULAR | Status: AC
  Filled 2024-09-20: qty 1

## 2024-09-20 MED ORDER — FENTANYL CITRATE (PF) 100 MCG/2ML IJ SOLN
INTRAMUSCULAR | Status: AC
  Filled 2024-09-20: qty 2

## 2024-09-20 MED ORDER — OXYCODONE-ACETAMINOPHEN 5-325 MG PO TABS
1.0000 | ORAL_TABLET | ORAL | Status: DC | PRN
  Administered 2024-09-21 – 2024-09-23 (×6): 2 via ORAL
  Filled 2024-09-20 (×6): qty 2

## 2024-09-20 MED ORDER — TRANEXAMIC ACID-NACL 1000-0.7 MG/100ML-% IV SOLN
INTRAVENOUS | Status: DC | PRN
Start: 2024-09-20 — End: 2024-09-20
  Administered 2024-09-20: 1000 mg via INTRAVENOUS

## 2024-09-20 MED ORDER — DIPHENHYDRAMINE HCL 50 MG/ML IJ SOLN: 12.5000 mg | INTRAMUSCULAR | Status: DC | PRN

## 2024-09-20 MED ORDER — FENTANYL CITRATE (PF) 100 MCG/2ML IJ SOLN
INTRAMUSCULAR | Status: DC | PRN
  Administered 2024-09-20: 15 ug via INTRATHECAL

## 2024-09-20 MED ORDER — COCONUT OIL OIL: 1.0000 | TOPICAL_OIL | Status: DC | PRN

## 2024-09-20 MED ORDER — LACTATED RINGERS IV BOLUS
1000.0000 mL | Freq: Once | INTRAVENOUS | Status: AC
  Administered 2024-09-20: 1000 mL via INTRAVENOUS

## 2024-09-20 MED ORDER — BUPIVACAINE IN DEXTROSE 0.75-8.25 % IT SOLN
INTRATHECAL | Status: DC | PRN
  Administered 2024-09-20: 1.6 mL via INTRATHECAL

## 2024-09-20 MED ORDER — SODIUM CHLORIDE 0.9% FLUSH: 3.0000 mL | INTRAVENOUS | Status: DC | PRN

## 2024-09-20 MED ORDER — ONDANSETRON HCL 4 MG/2ML IJ SOLN
INTRAMUSCULAR | Status: DC | PRN
Start: 2024-09-20 — End: 2024-09-20
  Administered 2024-09-20: 4 mg via INTRAVENOUS

## 2024-09-20 MED ORDER — ONDANSETRON HCL 4 MG/2ML IJ SOLN
INTRAMUSCULAR | Status: AC
  Filled 2024-09-20: qty 2

## 2024-09-20 MED ORDER — SIMETHICONE 80 MG PO CHEW: 80.0000 mg | CHEWABLE_TABLET | ORAL | Status: DC | PRN

## 2024-09-20 MED ORDER — NALOXONE HCL 4 MG/10ML IJ SOLN: 1.0000 ug/kg/h | INTRAVENOUS | Status: DC | PRN

## 2024-09-20 MED ORDER — WITCH HAZEL-GLYCERIN EX PADS: 1.0000 | MEDICATED_PAD | CUTANEOUS | Status: DC | PRN

## 2024-09-20 MED ORDER — ENOXAPARIN SODIUM 40 MG/0.4ML IJ SOSY
40.0000 mg | PREFILLED_SYRINGE | INTRAMUSCULAR | Status: DC
Start: 2024-09-21 — End: 2024-09-23
  Administered 2024-09-21 – 2024-09-22 (×2): 40 mg via SUBCUTANEOUS
  Filled 2024-09-20 (×3): qty 0.4

## 2024-09-20 MED ORDER — IBUPROFEN 600 MG PO TABS
600.0000 mg | ORAL_TABLET | Freq: Four times a day (QID) | ORAL | Status: DC
  Administered 2024-09-20 – 2024-09-23 (×11): 600 mg via ORAL
  Filled 2024-09-20 (×12): qty 1

## 2024-09-20 MED ORDER — PHENYLEPHRINE HCL-NACL 20-0.9 MG/250ML-% IV SOLN
INTRAVENOUS | Status: DC | PRN
  Administered 2024-09-20: 60 ug/min via INTRAVENOUS

## 2024-09-20 MED ORDER — ACETAMINOPHEN 10 MG/ML IV SOLN
INTRAVENOUS | Status: DC | PRN
  Administered 2024-09-20: 1000 mg via INTRAVENOUS

## 2024-09-20 MED ORDER — KETOROLAC TROMETHAMINE 30 MG/ML IJ SOLN: 30.0000 mg | Freq: Four times a day (QID) | INTRAMUSCULAR | Status: DC | PRN

## 2024-09-20 MED ORDER — DIPHENHYDRAMINE HCL 25 MG PO CAPS
25.0000 mg | ORAL_CAPSULE | ORAL | Status: DC | PRN
  Filled 2024-09-20: qty 1

## 2024-09-20 MED ORDER — ACETAMINOPHEN 500 MG PO TABS
1000.0000 mg | ORAL_TABLET | Freq: Four times a day (QID) | ORAL | Status: AC
  Administered 2024-09-20 – 2024-09-21 (×3): 1000 mg via ORAL
  Filled 2024-09-20 (×3): qty 2

## 2024-09-20 MED ORDER — FENTANYL CITRATE (PF) 100 MCG/2ML IJ SOLN
50.0000 ug | INTRAMUSCULAR | Status: DC | PRN
  Administered 2024-09-20: 100 ug via INTRAVENOUS
  Filled 2024-09-20: qty 2

## 2024-09-20 MED ORDER — LACTATED RINGERS IV SOLN: INTRAVENOUS | Status: DC | PRN

## 2024-09-20 MED ORDER — DROPERIDOL 2.5 MG/ML IJ SOLN: 0.6250 mg | Freq: Once | INTRAMUSCULAR | Status: DC | PRN

## 2024-09-20 MED ORDER — KETOROLAC TROMETHAMINE 30 MG/ML IJ SOLN
30.0000 mg | Freq: Once | INTRAMUSCULAR | Status: AC
Start: 2024-09-20 — End: 2024-09-20
  Administered 2024-09-20: 30 mg via INTRAVENOUS

## 2024-09-20 MED ORDER — MENTHOL 3 MG MT LOZG: 1.0000 | LOZENGE | OROMUCOSAL | Status: DC | PRN

## 2024-09-20 MED ORDER — SIMETHICONE 80 MG PO CHEW
80.0000 mg | CHEWABLE_TABLET | Freq: Three times a day (TID) | ORAL | Status: DC
  Administered 2024-09-21 – 2024-09-23 (×7): 80 mg via ORAL
  Filled 2024-09-20 (×8): qty 1

## 2024-09-20 MED ORDER — ONDANSETRON HCL 4 MG/2ML IJ SOLN: 4.0000 mg | Freq: Three times a day (TID) | INTRAMUSCULAR | Status: DC | PRN

## 2024-09-20 MED ORDER — PRENATAL MULTIVITAMIN CH
1.0000 | ORAL_TABLET | Freq: Every day | ORAL | Status: DC
  Administered 2024-09-22 – 2024-09-23 (×2): 1 via ORAL
  Filled 2024-09-20 (×3): qty 1

## 2024-09-20 MED ORDER — DEXAMETHASONE SODIUM PHOSPHATE 10 MG/ML IJ SOLN
INTRAMUSCULAR | Status: DC | PRN
  Administered 2024-09-20: 10 mg via INTRAVENOUS

## 2024-09-20 MED ORDER — OXYTOCIN-SODIUM CHLORIDE 30-0.9 UT/500ML-% IV SOLN
2.5000 [IU]/h | INTRAVENOUS | Status: AC
  Administered 2024-09-20: 2.5 [IU]/h via INTRAVENOUS
  Filled 2024-09-20: qty 500

## 2024-09-20 MED ORDER — DIBUCAINE (PERIANAL) 1 % EX OINT: 1.0000 | TOPICAL_OINTMENT | CUTANEOUS | Status: DC | PRN

## 2024-09-20 MED ORDER — NALOXONE HCL 0.4 MG/ML IJ SOLN: 0.4000 mg | INTRAMUSCULAR | Status: DC | PRN

## 2024-09-20 SURGICAL SUPPLY — 28 items
BENZOIN TINCTURE PRP APPL 2/3 (GAUZE/BANDAGES/DRESSINGS) IMPLANT
CHLORAPREP W/TINT 26 (MISCELLANEOUS) ×2 IMPLANT
CLAMP UMBILICAL CORD (MISCELLANEOUS) ×1 IMPLANT
DRSG OPSITE POSTOP 4X10 (GAUZE/BANDAGES/DRESSINGS) ×1 IMPLANT
ELECTRODE REM PT RTRN 9FT ADLT (ELECTROSURGICAL) ×1 IMPLANT
EXTRACTOR VACUUM M CUP 4 TUBE (SUCTIONS) IMPLANT
GAUZE PAD ABD 7.5X8 STRL (GAUZE/BANDAGES/DRESSINGS) IMPLANT
GAUZE SPONGE 4X4 12PLY STRL LF (GAUZE/BANDAGES/DRESSINGS) IMPLANT
GLOVE BIOGEL PI IND STRL 6.5 (GLOVE) ×1 IMPLANT
GLOVE BIOGEL PI IND STRL 7.0 (GLOVE) ×1 IMPLANT
GLOVE SURG SS PI 6.5 STRL IVOR (GLOVE) ×1 IMPLANT
GOWN STRL REUS W/TWL LRG LVL3 (GOWN DISPOSABLE) ×2 IMPLANT
KIT ABG SYR 3ML LUER SLIP (SYRINGE) IMPLANT
NDL HYPO 25X5/8 SAFETYGLIDE (NEEDLE) IMPLANT
NEEDLE HYPO 25X5/8 SAFETYGLIDE (NEEDLE) IMPLANT
NS IRRIG 1000ML POUR BTL (IV SOLUTION) ×1 IMPLANT
PACK C SECTION WH (CUSTOM PROCEDURE TRAY) ×1 IMPLANT
PAD ABD DERMACEA PRESS 5X9 (GAUZE/BANDAGES/DRESSINGS) IMPLANT
PAD OB MATERNITY 4.3X12.25 (PERSONAL CARE ITEMS) ×1 IMPLANT
RTRCTR C-SECT PINK 25CM LRG (MISCELLANEOUS) IMPLANT
STRIP CLOSURE SKIN 1/2X4 (GAUZE/BANDAGES/DRESSINGS) IMPLANT
SUT PLAIN 0 NONE (SUTURE) IMPLANT
SUT VIC AB 0 CT1 36 (SUTURE) ×4 IMPLANT
SUT VIC AB 4-0 KS 27 (SUTURE) ×1 IMPLANT
TAPE CLOTH SURG 4X10 WHT LF (GAUZE/BANDAGES/DRESSINGS) IMPLANT
TOWEL OR 17X24 6PK STRL BLUE (TOWEL DISPOSABLE) ×1 IMPLANT
TRAY FOLEY W/BAG SLVR 14FR LF (SET/KITS/TRAYS/PACK) ×1 IMPLANT
WATER STERILE IRR 1000ML POUR (IV SOLUTION) ×1 IMPLANT

## 2024-09-20 NOTE — Transfer of Care (Signed)
 Immediate Anesthesia Transfer of Care Note  Patient: Chelsea Herman  Procedure(s) Performed: CESAREAN DELIVERY  Patient Location: PACU  Anesthesia Type:Spinal  Level of Consciousness: awake, alert , and oriented  Airway & Oxygen Therapy: Patient Spontanous Breathing  Post-op Assessment: Report given to RN and Post -op Vital signs reviewed and stable  Post vital signs: Reviewed and stable  Last Vitals:  Vitals Value Taken Time  BP 103/67 09/20/24 13:45  Temp    Pulse 82 09/20/24 13:47  Resp 17 09/20/24 13:47  SpO2 94 % 09/20/24 13:47  Vitals shown include unfiled device data.  Last Pain:  Vitals:   09/20/24 1142  TempSrc:   PainSc: 8          Complications: No notable events documented.

## 2024-09-20 NOTE — Anesthesia Procedure Notes (Signed)
 Spinal  Patient location during procedure: OR Start time: 09/20/2024 12:12 PM End time: 09/20/2024 12:15 PM Reason for block: surgical anesthesia Staffing Performed: anesthesiologist  Anesthesiologist: Paul Lamarr BRAVO, MD Performed by: Paul Lamarr BRAVO, MD Authorized by: Paul Lamarr BRAVO, MD   Preanesthetic Checklist Completed: patient identified, IV checked, risks and benefits discussed, monitors and equipment checked, pre-op evaluation and timeout performed Spinal Block Patient position: sitting Prep: DuraPrep and site prepped and draped Patient monitoring: heart rate, continuous pulse ox and blood pressure Approach: midline Location: L3-4 Injection technique: single-shot Needle Needle type: Pencan  Needle gauge: 24 G Needle length: 10 cm Assessment Sensory level: T4 Events: CSF return Additional Notes Risks, benefits, and alternative discussed. Patient gave consent to procedure. Prepped and draped in sitting position. Clear CSF obtained after one needle redirection. Positive terminal aspiration. No pain or paraesthesias with injection. Patient tolerated procedure well. Vital signs stable. LANEY Paul, MD

## 2024-09-20 NOTE — Anesthesia Postprocedure Evaluation (Signed)
 Anesthesia Post Note  Patient: Chelsea Herman  Procedure(s) Performed: CESAREAN DELIVERY     Patient location during evaluation: PACU Anesthesia Type: Spinal Level of consciousness: awake and alert Pain management: pain level controlled Vital Signs Assessment: post-procedure vital signs reviewed and stable Respiratory status: spontaneous breathing, nonlabored ventilation and respiratory function stable Cardiovascular status: blood pressure returned to baseline Postop Assessment: no apparent nausea or vomiting and spinal receding Anesthetic complications: no   No notable events documented.  Last Vitals:  Vitals:   09/20/24 1410 09/20/24 1425  BP: (!) 105/57 104/63  Pulse: 66 66  Resp: (!) 22 17  Temp:  36.4 C  SpO2: 95% 95%    Last Pain:  Vitals:   09/20/24 1425  TempSrc: Oral  PainSc: 0-No pain   Pain Goal:    LLE Motor Response: Purposeful movement (09/20/24 1425) LLE Sensation: Tingling (09/20/24 1425) RLE Motor Response: Purposeful movement (09/20/24 1425) RLE Sensation: Tingling (09/20/24 1425)     Epidural/Spinal Function Cutaneous sensation: Tingles (09/20/24 1425), Patient able to flex knees: No (09/20/24 1425), Patient able to lift hips off bed: No (09/20/24 1425), Back pain beyond tenderness at insertion site: No (09/20/24 1425), Progressively worsening motor and/or sensory loss: No (09/20/24 1425), Bowel and/or bladder incontinence post epidural: No (09/20/24 1425)  Vertell Row

## 2024-09-20 NOTE — H&P (Signed)
 OBSTETRIC ADMISSION HISTORY AND PHYSICAL  Chelsea Herman is a 18 y.o. female [phone] with IUP at [redacted]w[redacted]d by LMP c/w 6wk US  presenting for contractions/PROM also with scheduled primary cesarean today for HSV outbreak on 9/22 not on Valtrex . She reports +FMs, No LOF, no VB, no blurry vision, headaches or peripheral edema, and RUQ pain.  She plans on breast and bottle feeding. She request POPs for birth control. She received her prenatal care at [facility]   Dating: By LMP c/w 6wk US  --->  Estimated Date of Delivery: 09/26/24  Sono:    @[redacted]w[redacted]d , CWD, normal anatomy, cephalic presentation, anterior placenta, 2875g, 57% EFW   Prenatal History/Complications: Velamentous cord insertion, hx of pre-eclampsia in prior pregnancy, HSV outbreak 09/16/24  Past Medical History: Past Medical History:  Diagnosis Date   Asthma    as a child-no current medications or treatments   Dysrhythmia    has palpitations scheduled to see Dr Sheena but not yet   Gestational diabetes 2024   2nd preg   History of gestational hypertension 02/04/2021   first   Hypertension    Migraines     Past Surgical History: Past Surgical History:  Procedure Laterality Date   NO PAST SURGERIES      Obstetrical History: OB History     Gravida  4   Para  2   Term  2   Preterm  0   AB  1   Living  2      SAB  1   IAB  0   Ectopic  0   Multiple  0   Live Births  2           Social History Social History   Socioeconomic History   Marital status: Single    Spouse name: Not on file   Number of children: Not on file   Years of education: Not on file   Highest education level: Not on file  Occupational History   Not on file  Tobacco Use   Smoking status: Never    Passive exposure: Never   Smokeless tobacco: Never  Vaping Use   Vaping status: Never Used  Substance and Sexual Activity   Alcohol use: Not Currently   Drug use: Never   Sexual activity: Not Currently    Partners: Male     Birth control/protection: None  Other Topics Concern   Not on file  Social History Narrative   ** Merged History Encounter **       Social Drivers of Health   Financial Resource Strain: Not on file  Food Insecurity: Food Insecurity Present (09/14/2024)   Hunger Vital Sign    Worried About Running Out of Food in the Last Year: Often true    Ran Out of Food in the Last Year: Often true  Transportation Needs: No Transportation Needs (09/14/2024)   PRAPARE - Administrator, Civil Service (Medical): No    Lack of Transportation (Non-Medical): No  Physical Activity: Not on file  Stress: Not on file  Social Connections: Not on file    Family History: Family History  Problem Relation Age of Onset   Hearing loss Mother    Schizophrenia Father    Asthma Neg Hx    Cancer Neg Hx    Diabetes Neg Hx    Stroke Neg Hx     Allergies: Allergies  Allergen Reactions   Peanut-Containing Drug Products Anaphylaxis   Citric Acid Rash    Medications  Prior to Admission  Medication Sig Dispense Refill Last Dose/Taking   acetaminophen  (TYLENOL ) 500 MG tablet Take 2 tablets (1,000 mg total) by mouth every 6 (six) hours as needed for mild pain (pain score 1-3). 60 tablet 2 Past Week   albuterol  (VENTOLIN  HFA) 108 (90 Base) MCG/ACT inhaler Inhale 1-2 puffs into the lungs every 6 (six) hours as needed for wheezing or shortness of breath. 1 each 0 Past Week   cyclobenzaprine  (FLEXERIL ) 10 MG tablet Take 1 tablet (10 mg total) by mouth 2 (two) times daily as needed for muscle spasms. 20 tablet 1 Past Month   hydrOXYzine  (ATARAX ) 25 MG tablet Take 1 tablet (25 mg total) by mouth every 6 (six) hours as needed. (Patient taking differently: Take 25 mg by mouth every 6 (six) hours as needed for anxiety.) 12 tablet 0 Past Week   metoCLOPramide  (REGLAN ) 10 MG tablet Take 1 tablet (10 mg total) by mouth every 6 (six) hours. 30 tablet 0 Past Month   potassium chloride  SA (KLOR-CON  M) 20 MEQ tablet Take  2 tablets (40 mEq total) by mouth 2 (two) times daily. 10 tablet 0 Past Week   aspirin  EC 81 MG tablet Take 1 tablet (81 mg total) by mouth daily. Swallow whole. (Patient not taking: Reported on 09/19/2024) 30 tablet 12 Not Taking   Blood Pressure Monitoring (BLOOD PRESSURE KIT) DEVI 1 Device by Does not apply route as needed. 1 each 0    Prenatal Vit-Fe Fumarate-FA (PREPLUS) 27-1 MG TABS Take 1 tablet by mouth daily. (Patient not taking: Reported on 09/19/2024) 30 tablet 13 Not Taking     Review of Systems   All systems reviewed and negative except as stated in HPI  Blood pressure 131/81, pulse 85, temperature 98.3 F (36.8 C), temperature source Oral, resp. rate 18, last menstrual period 12/21/2023, SpO2 100%, unknown if currently breastfeeding. General appearance: alert, cooperative, and appears stated age Lungs: clear to auscultation bilaterally Heart: regular rate and rhythm Abdomen: soft, non-tender; bowel sounds normal Pelvic: deferred Extremities: SCDs on Fetal monitoringBaseline: 120 bpm, Variability: Good {> 6 bpm), Accelerations: Reactive, and Decelerations: Variable: mild Uterine activityFrequency: not picking up on the monitor  Dilation: 2.5 Effacement (%): 80 Station: -3, -2 Exam by:: jolynn   Prenatal labs: ABO, Rh: --/--/A POS (09/24 1003) Antibody: NEG (09/24 1003) Rubella: 2.77 (02/26 0932) RPR: NON REACTIVE (09/24 0953)  HBsAg: Negative (02/26 0932)  HIV: Non Reactive (06/30 0840)  GBS:      Lab Results  Component Value Date   GBS Negative 12/30/2022   Immunization History  Administered Date(s) Administered   DTaP 02/28/2005, 05/11/2005, 07/15/2005, 07/10/2006, 10/16/2009   HIB (PRP-OMP) 02/28/2005, 05/11/2005, 01/02/2006   Hepatitis A 07/10/2006, 08/22/2007   Hepatitis B, PED/ADOLESCENT 07-26-04, 01/07/2005, 09/02/2005   IPV 02/28/2005, 05/11/2005, 07/15/2005, 10/16/2009   MMR 01/02/2006, 10/16/2009   Meningococcal Conjugate 07/25/2018    Pneumococcal Conjugate-13 10/16/2009   Pneumococcal-Unspecified 02/28/2005, 05/11/2005, 07/15/2005, 01/02/2006   Tdap 07/25/2018   Varicella 01/02/2006, 10/16/2009          NURSING  PROVIDER  Office Location [facility] Dating by LMP c/w U/S at 6.2 wks  [facility] Anatomy U/S    Initiated care at  wks                 Language  English               LAB RESULTS   Support Person Spouse Genetics NIPS:  AFP:  NT/IT (FT only)        Carrier Screen Horizon:   Rhogam  A/Positive/-- (02/26 0932) A1C/GTT Early HgbA1C:  Third trimester 2 hr GTT:   Flu Vaccine  Declines      TDaP Vaccine  Declines Blood Type A/Positive/-- (02/26 0932)  RSV Vaccine   Antibody Negative (02/26 0932)  COVID Vaccine  No Rubella 2.77 (02/26 0932)  Feeding Plan both RPR Non Reactive (02/26 0932)  Contraception no method HBsAg Negative (02/26 0932)  Circumcision  No HIV Non Reactive (02/26 0932)  Pediatrician    HCVAb Non Reactive (02/26 0932)  Prenatal Classes        BTL Consent   Pap    BTL Pre-payment   GC/CT Initial:   36wks:    VBAC Consent   GBS For PCN allergy, check sensitivities   BRx Optimized? [ ]  yes   [ ]  no      DME Rx [ ]  BP cuff [ ]  Weight Scale Waterbirth  [ ]  Class [ ]  Consent [ ]  CNM visit  PHQ9 & GAD7 [  ] new OB [  ] 28 weeks  [  ] 36 weeks Induction  [ ]  Orders Entered [ ] Foley Y/N     No results found for this or any previous visit (from the past 24 hours).  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 [id number] at [redacted]w[redacted]d here for   Patient was consented for repeat cesarean section. Risks including bleeding, infection, damage to nearby structures/organs including bowel, bladder, ureters discussed with patient. In rare cases of life-threatening bleeding, need for blood transfusion and very rarely as a life-saving measure hysterectomy as well as increasing risk of bleeding with increasing number of c-sections.    #Labor:scheduled elective cesarean section for recent HSV outbreak and velamentous cord insertion #Pain:  spinal #GBS status:   NA--scheduled CS #Feeding: Breastmilk  and Formula #Reproductive Life planning: no method #Circ:   not applicable  Leeroy KATHEE Pouch, MD  09/20/2024, 10:18 AM

## 2024-09-20 NOTE — Op Note (Signed)
 Walter Schaben PROCEDURE DATE: 09/20/2024  PREOPERATIVE DIAGNOSIS: Intrauterine pregnancy at  [redacted]w[redacted]d weeks gestation; herpes virus infection  POSTOPERATIVE DIAGNOSIS: The same  PROCEDURE:     Cesarean Section  SURGEON:  Dr. Winton Felt  ASSISTANT: Dr. FORBES Pouch  An experienced assistant was required given the standard of surgical care given the complexity of the case.  This assistant was needed for exposure, dissection, suctioning, retraction, instrument exchange,  assisting with delivery with administration of fundal pressure, and for overall help during the procedure.  INDICATIONS: Chelsea Herman is a 18 y.o. [id number] at [redacted]w[redacted]d scheduled for cesarean section secondary to herpes virus infection.  The risks of cesarean section discussed with the patient included but were not limited to: bleeding which may require transfusion or reoperation; infection which may require antibiotics; injury to bowel, bladder, ureters or other surrounding organs; injury to the fetus; need for additional procedures including hysterectomy in the event of a life-threatening hemorrhage; placental abnormalities wth subsequent pregnancies, incisional problems, thromboembolic phenomenon and other postoperative/anesthesia complications. The patient concurred with the proposed plan, giving informed written consent for the procedure.    FINDINGS:  Viable female infant in cephalic presentation.  Apgars 8 and 9, weight, 8 pounds and 1.5 ounces.  Clear amniotic fluid.  Intact placenta, three vessel cord.  Normal uterus, fallopian tubes and ovaries bilaterally.  ANESTHESIA:    Spinal INTRAVENOUS FLUIDS:2300 ml ESTIMATED BLOOD LOSS: 839 ml URINE OUTPUT:  75 ml SPECIMENS: Placenta sent to L&D COMPLICATIONS: None immediate  PROCEDURE IN DETAIL:  The patient received intravenous antibiotics and had sequential compression devices applied to her lower extremities while in the preoperative area.  She was then taken to the operating  room where anesthesia was induced and was found to be adequate. A foley catheter was placed into her bladder and attached to Raveena Hebdon gravity. She was then placed in a dorsal supine position with a leftward tilt, and prepped and draped in a sterile manner. After an adequate timeout was performed, a Pfannenstiel skin incision was made with scalpel and carried through to the underlying layer of fascia. The fascia was incised in the midline and this incision was extended bilaterally bluntly. The rectus muscles were separated in the midline bluntly and the peritoneum was entered bluntly. The Alexis self-retaining retractor was introduced into the abdominal cavity. Attention was turned to the lower uterine segment where a bladder flap was created, and a transverse hysterotomy was made with a scalpel and extended bilaterally bluntly. The infant was successfully delivered. Delayed cord clamping was performed for 1 minute and the cord was clamped and cut and infant was handed over to awaiting neonatology team. Uterine massage was then administered and the placenta delivered intact with three-vessel cord. The uterus was cleared of clot and debris.  The hysterotomy was closed with 0 Vicryl in a running locked fashion, and an imbricating layer was also placed with a 0 Vicryl. Overall, excellent hemostasis was noted. The pelvis was cleared of all clot and debris. Hemostasis was confirmed on all surfaces.  The peritoneum and the muscles were reapproximated using 0 vicryl interrupted stitches. The fascia was then closed using 0 Vicryl in a running fashion.  The subcutaneous layer was reapproximated with plain gut and the skin was closed in a subcuticular fashion using 3.0 Vicryl. The patient tolerated the procedure well. Sponge, lap, instrument and needle counts were correct x 2. She was taken to the recovery room in stable condition.    Shanae Luo ConstantMD  09/20/2024 1:35 PM

## 2024-09-20 NOTE — MAU Note (Addendum)
 Chelsea Herman is a 18 y.o. at [redacted]w[redacted]d here in MAU reporting: arrived by EMS.  Pt for scheduled c/s today.  No leaking or bleeding.  Baby is moving. Started contracting at 0800.  Baby is moving.  Was nervous because last labor was fast.   Onset of complaint: 0800 Pain score: 8 Vitals:   09/20/24 0910  BP: 129/85  Pulse: 80  Resp: 18  Temp: 98.3 F (36.8 C)  SpO2: 100%     FHT:124 Lab orders placed from triage:     EMS started IV.  Pre-op blood work has been drawn.  Blood band on .  Did shower last night and this morning as instructed. Ate 9/25 2500, water  this morning at 0815

## 2024-09-20 NOTE — Lactation Note (Signed)
 This note was copied from a baby's chart. Lactation Consultation Note  Patient Name: Chelsea Herman Unijb'd Date: 09/20/2024 Age:21 hours Reason for consult: Initial assessment;MD order;Term;Maternal endocrine disorder;Breastfeeding assistance (Hx HSV)  P3- Pediatric NP requested for LC to consult with [name] because [name] was very tired and infant was rotting a lot. LC offered to assist with latching infant and [name] was receptive. [name] was a fresh csection and did not have a lot of energy, so LC placed infant in a side lying position. Infant was too eager and started to suck on her lip vs [name]'s breast. It took a few attempts to get infant latched because of this. [name] was able to easily express rolling colostrum out of her breast for stimulation. Once infant tasted the colostrum, she opened her mouth wide enough to latch. Infant had a strong rhythmic suck and multiple swallows were noted. Infant was still nursing when [facility] left room. RN was present with [name].  LC provided [name] with a manual pump (size 18 mm flange) per [name] request. LC reviewed the first 24 hr birthday nap, day 2 cluster feeding, feeding infant on cue 8-12x in 24 hrs, not allowing infant to go over 3 hrs without a feeding, CDC milk storage guidelines, LC services handout and engorgement/breast care. LC encouraged [name] to call for further assistance as needed.  Maternal Data Has patient been taught Hand Expression?: Yes Does the patient have breastfeeding experience prior to this delivery?: Yes How long did the patient breastfeed?: 2 years with first infant, 4 months with second due to him biting [name] (bf for 3 months, stopped, tried again at 6 months and went to 7 months)  Feeding Mother's Current Feeding Choice: Breast Milk  LATCH Score Latch: Grasps breast easily, tongue down, lips flanged, rhythmical sucking.  Audible Swallowing: Spontaneous and intermittent  Type of Nipple: Everted at rest and after stimulation  Comfort  (Breast/Nipple): Soft / non-tender  Hold (Positioning): Assistance needed to correctly position infant at breast and maintain latch.  LATCH Score: 9   Lactation Tools Discussed/Used Tools: Pump;Flanges Flange Size: 18 Breast pump type: Manual Pump Education: Setup, frequency, and cleaning;Milk Storage Reason for Pumping: [name] request Pumping frequency: 15-20 min every 3 hrs  Interventions Interventions: Breast feeding basics reviewed;Assisted with latch;Hand express;Breast compression;Adjust position;Support pillows;Position options;Hand pump;Education;LC Services brochure  Discharge Discharge Education: Engorgement and breast care;Warning signs for feeding baby Pump: Manual;Hands Free;Personal  Consult Status Consult Status: Follow-up Date: 09/21/24 Follow-up type: In-patient    Recardo Hoit BS, IBCLC 09/20/2024, 4:22 PM

## 2024-09-21 ENCOUNTER — Encounter (HOSPITAL_COMMUNITY): Payer: Self-pay | Admitting: Obstetrics and Gynecology

## 2024-09-21 LAB — CBC
HCT: 25 % — ABNORMAL LOW (ref 36.0–46.0)
Hemoglobin: 8.2 g/dL — ABNORMAL LOW (ref 12.0–15.0)
MCH: 25.1 pg — ABNORMAL LOW (ref 26.0–34.0)
MCHC: 32.8 g/dL (ref 30.0–36.0)
MCV: 76.5 fL — ABNORMAL LOW (ref 80.0–100.0)
Platelets: 173 K/uL (ref 150–400)
RBC: 3.27 MIL/uL — ABNORMAL LOW (ref 3.87–5.11)
RDW: 13.6 % (ref 11.5–15.5)
WBC: 14.4 K/uL — ABNORMAL HIGH (ref 4.0–10.5)
nRBC: 0 % (ref 0.0–0.2)

## 2024-09-21 NOTE — Social Work (Signed)
 CSW acknowledged consult and attempted to meet with [name] on two occasions. [name] had visitors and asked CSW to come later.  CSW will meet with [name] at a later time.  Cena Ligas, LCSW Clinical Social Worker

## 2024-09-21 NOTE — Progress Notes (Signed)
 Subjective: Postpartum Day 1: Cesarean Delivery Patient reports incisional pain, tolerating PO, and no problems voiding.    Objective: Vital signs in last 24 hours: Temp:  [97.6 F (36.4 C)-98.4 F (36.9 C)] 98.2 F (36.8 C) (09/27 0850) Pulse Rate:  [48-79] 58 (09/27 0850) Resp:  [16-25] 16 (09/27 0850) BP: (103-118)/(51-72) 113/55 (09/27 0850) SpO2:  [95 %-100 %] 100 % (09/27 0850)  Physical Exam:  General: alert, cooperative, and no distress Lochia: appropriate Uterine Fundus: firm Incision: no significant drainage DVT Evaluation: No evidence of DVT seen on physical exam.  Recent Labs    09/20/24 1540 09/21/24 0432  HGB 9.1* 8.2*  HCT 28.5* 25.0*    Assessment/Plan: Status post Cesarean section. Doing well postoperatively.  Continue current care. Lactation consult placed  Olam Boards, CNM 09/21/2024, 10:06 AM

## 2024-09-22 MED ORDER — FERROUS SULFATE 325 (65 FE) MG PO TABS
325.0000 mg | ORAL_TABLET | ORAL | Status: DC
  Administered 2024-09-22 – 2024-09-23 (×2): 325 mg via ORAL
  Filled 2024-09-22 (×2): qty 1

## 2024-09-22 MED ORDER — IBUPROFEN 600 MG PO TABS
600.0000 mg | ORAL_TABLET | Freq: Four times a day (QID) | ORAL | 0 refills | Status: AC
  Filled 2024-09-22: qty 30, 8d supply, fill #0

## 2024-09-22 MED ORDER — FERROUS SULFATE 325 (65 FE) MG PO TABS
325.0000 mg | ORAL_TABLET | ORAL | 0 refills | Status: AC
  Filled 2024-09-22: qty 30, 70d supply, fill #0

## 2024-09-22 MED ORDER — OXYCODONE HCL 5 MG PO TABS
5.0000 mg | ORAL_TABLET | Freq: Four times a day (QID) | ORAL | 0 refills | Status: AC | PRN
  Filled 2024-09-22: qty 12, 3d supply, fill #0

## 2024-09-22 MED ORDER — SENNOSIDES-DOCUSATE SODIUM 8.6-50 MG PO TABS
2.0000 | ORAL_TABLET | Freq: Every evening | ORAL | 0 refills | Status: AC | PRN
  Filled 2024-09-22: qty 30, 15d supply, fill #0

## 2024-09-22 NOTE — Lactation Note (Signed)
 This note was copied from a baby's chart. Lactation Consultation Note  Patient Name: Chelsea Herman Unijb'd Date: 09/22/2024 Age:20 hours   Attempted to see mom but everyone was sleeping.  Maternal Data    Feeding    LATCH Score                    Lactation Tools Discussed/Used    Interventions    Discharge    Consult Status      Tresia Revolorio G 09/22/2024, 6:39 AM
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
## 2024-09-22 NOTE — Progress Notes (Signed)
 POSTPARTUM PROGRESS NOTE POD #2  Subjective:  Chelsea Herman is a 18 y.o. H5E6986 s/p pLTCS at [redacted]w[redacted]d. No acute events overnight. She reports she is doing well. She denies any problems with ambulating, voiding or po intake. Denies nausea or vomiting. She has passed flatus. Pain is moderately controlled.  Lochia is appropriate. She had an episode of dizziness while showering, resolved with resting on the edge of the bed.   Objective: Blood pressure 115/68, pulse (!) 58, temperature 97.8 F (36.6 C), temperature source Oral, resp. rate 18, last menstrual period 12/21/2023, SpO2 98%, unknown if currently breastfeeding.  Physical Exam:  General: alert, cooperative and no distress Heart: regular rate Resp: nonlabored Uterine Fundus: firm, appropriately tender Extremities: Trace edema Skin: warm, dry; incision clean/dry/intact w/ honeycomb dressing in place  Recent Labs    09/20/24 1540 09/21/24 0432  HGB 9.1* 8.2*  HCT 28.5* 25.0*    Assessment/Plan: Chelsea Herman is a 18 y.o. H5E6986 s/p pLTCS at [redacted]w[redacted]d for HSV.  POD#2 - Doing well; pain controlled.  - Routine postpartum care - Lovenox  for VTE prophylaxis - po iron q2d   HSV - recent   Feeding: combo Contraception: none   Dispo: Plan for discharge POD3.  LOS: 2 days   Barabara Maier, DO FMOB Fellow, Faculty Practice [facility], Center for [employer]

## 2024-09-22 NOTE — Lactation Note (Signed)
 This note was copied from a baby's chart. Lactation Consultation Note  Patient Name: Chelsea Herman Unijb'd Date: 09/22/2024 Age:20 hours Reason for consult: Follow-up assessment;Mother's request;Term;Maternal endocrine disorder;Breastfeeding assistance  P3- [name] was teary eyed telling LC that infant has been refusing to latch to the breast, so she has had to offer formula. LC reviewed infant's feedings for the past two days, infant's minimal weight loss and infant's sufficient output. LC also reviewed cluster feeding and what that looks like. 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 [name] reports being concerned that when she pumps, nothing comes out. When [name] hand expresses, rolling colostrum comes out of her breast. LC reviewed the difference between colostrum vs transitional milk vs mature milk.   Infant had just finished 15 mL of formula, but LC still offered to latch infant to work on feedings. [name] was receptive. LC placed infant on the right breast in the football hold. Infant was too distracted and over stimulated to latch, so LC tightly swaddled her to reduce her senses. At this time, infant latched and nursed for 6 minutes. [name] was disappointed that infant nursed for only 6 minutes, but LC reminded her that infant had just had 15 mL of formula. LC encouraged [name] to call out for infant's next feeding for latch assistance.  Maternal Data Has patient been taught Hand Expression?: Yes Does the patient have breastfeeding experience prior to this delivery?: Yes How long did the patient breastfeed?: 2 years with first child and 4 months total with second child (due to biting)  Feeding Mother's Current Feeding Choice: Breast Milk  LATCH Score Latch: Grasps breast easily, tongue down, lips flanged, rhythmical sucking.  Audible Swallowing: Spontaneous and intermittent  Type of Nipple: Everted at rest and after stimulation  Comfort (Breast/Nipple):  Soft / non-tender  Hold (Positioning): Assistance needed to correctly position infant at breast and maintain latch.  LATCH Score: 9   Lactation Tools Discussed/Used Tools: Pump;Flanges Breast pump type: Double-Electric Breast Pump;Manual Pump Education: Setup, frequency, and cleaning;Milk Storage Reason for Pumping: [name] request Pumping frequency: 15-20 min every 3 hrs  Interventions Interventions: Breast feeding basics reviewed;Assisted with latch;Hand express;Breast compression;Adjust position;Support pillows;Position options;Education;LC Services brochure  Discharge Discharge Education: Engorgement and breast care;Warning signs for feeding baby Pump: Manual;Hands Free;Personal  Consult Status Consult Status: Follow-up Date: 09/23/24 Follow-up type: In-patient    Recardo Hoit BS, IBCLC 09/22/2024, 6:35 PM

## 2024-09-23 ENCOUNTER — Encounter: Admitting: Family Medicine

## 2024-09-23 ENCOUNTER — Other Ambulatory Visit (HOSPITAL_COMMUNITY): Payer: Self-pay

## 2024-09-23 NOTE — Lactation Note (Signed)
 This note was copied from a baby's chart. Lactation Consultation Note  Patient Name: Chelsea Herman Unijb'd Date: 09/23/2024 Age:20 hours Reason for consult: Follow-up assessment;Term  P3, Baby was latched when [facility] entered room with good depth and intermittent swallows.  Mother states baby has been cluster feeding.  Baby has been supplemented with formula but states she plans to only breastfeed. Reviewed engorgement care and monitoring voids/stools. Suggest calling for  help as needed.  Maternal Data Has patient been taught Hand Expression?: Yes  Feeding Mother's Current Feeding Choice: Breast Milk and Formula  LATCH Score Latch: Grasps breast easily, tongue down, lips flanged, rhythmical sucking.  Audible Swallowing: A few with stimulation  Type of Nipple: Everted at rest and after stimulation  Comfort (Breast/Nipple): Soft / non-tender  Hold (Positioning): No assistance needed to correctly position infant at breast.  LATCH Score: 9  Interventions Interventions: Education  Discharge Discharge Education: Engorgement and breast care;Warning signs for feeding baby  Consult Status Consult Status: Complete Date: 09/23/24   Shannon Levorn Lemme  RN, IBCLC 09/23/2024, 1:01 PM

## 2024-09-23 NOTE — Patient Instructions (Signed)
 Your appointment with Outpatient Lactation is: Date:10/11/2024 Time:11:30am [facility] (First Floor) [address] [place]  Check in under baby's name.  Please bring your baby hungry along with your pump and a bottle of either formula or expressed breast milk. Please also bring your pump flanges and we welcome support people! If you need lactation assistance before your appointment, please call [phone] for lactation voice mail.  -- [facility] Lactation Support Group  Please join us  for our [facility] [employer] Lactation Support Group at [employer] We meet every Tuesday at 10:00 am to 12:00 pm at [employer] on the second floor in the conference room Lactating parents and lap babies are welcome, no registration is required, if you have a lactation pillow please bring

## 2024-09-23 NOTE — Discharge Summary (Signed)
 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 yo H5E6986 at [redacted]w[redacted]d was admitted to the hospital 09/20/2024 for scheduled cesarean section with the following indication:Active HSV.Delivery details are as follows:  Membrane Rupture Time/Date: 12:28 PM,09/20/2024  Delivery Method:C-Section, Low Transverse Operative Delivery:N/A Details of operation can be found in separate operative note.  Patient had a postpartum course that was uncomplicated.  She is ambulating, tolerating a regular diet, passing flatus, and urinating well. Patient is discharged home in stable condition on  09/23/24        Newborn Data: Birth date:09/20/2024 Birth time:12:37 PM Gender:Female Living status:Living Apgars:8 ,9  Weight:3670 g    Magnesium  Sulfate received: No BMZ received: No Rhophylac:N/A MMR:N/A T-DaP:declined Flu: declined RSV Vaccine received: No Transfusion:No  Immunizations received: Immunization History  Administered Date(s) Administered   DTaP 02/28/2005, 05/11/2005, 07/15/2005, 07/10/2006, 10/16/2009   HIB (PRP-OMP) 02/28/2005, 05/11/2005, 01/02/2006   Hepatitis A 07/10/2006, 08/22/2007   Hepatitis B, PED/ADOLESCENT 2003/12/29, 01/07/2005, 09/02/2005   IPV 02/28/2005,  05/11/2005, 07/15/2005, 10/16/2009   MMR 01/02/2006, 10/16/2009   Meningococcal Conjugate 07/25/2018   Pneumococcal Conjugate-13 10/16/2009   Pneumococcal-Unspecified 02/28/2005, 05/11/2005, 07/15/2005, 01/02/2006   Tdap 07/25/2018   Varicella 01/02/2006, 10/16/2009    Physical exam  Vitals:   09/22/24 0051 09/22/24 0559 09/22/24 1948 09/23/24 0515  BP: 99/65 115/68 122/71 121/75  Pulse: 83 (!) 58 79 71  Resp:  18 18 18   Temp:  97.8 F (36.6 C) 98.6 F (37 C) 98.6 F (37 C)  TempSrc:  Oral Oral Oral  SpO2:  98%  100%   General: alert, cooperative, and no distress Lochia: appropriate Uterine Fundus: firm Incision: Healing well with no significant drainage, No significant erythema, Dressing is clean, dry, and intact DVT Evaluation: No evidence of DVT seen on physical exam. No cords or calf tenderness. No significant calf/ankle edema. Labs: Lab Results  Component Value Date   WBC 14.4 (H) 09/21/2024   HGB 8.2 (L) 09/21/2024   HCT 25.0 (L) 09/21/2024   MCV 76.5 (L) 09/21/2024   PLT 173 09/21/2024      Latest Ref Rng & Units 09/20/2024    3:40 PM  CMP  Creatinine 0.44 - 1.00 mg/dL 9.22    Edinburgh Score:    09/22/2024    1:29 PM  Edinburgh Postnatal Depression Scale Screening Tool  I have been able to laugh and see the funny side of things. 0  I have looked forward with enjoyment to things. 0  I have blamed myself unnecessarily when things went wrong. 1  I have been anxious or worried for no good reason. 2  I have felt scared or panicky for no  good reason. 2  Things have been getting on top of me. 1  I have been so unhappy that I have had difficulty sleeping. 0  I have felt sad or miserable. 2  I have been so unhappy that I have been crying. 1  The thought of harming myself has occurred to me. 0  Edinburgh Postnatal Depression Scale Total 9   Edinburgh Postnatal Depression Scale Total: 9   After visit meds:  Allergies as of 09/23/2024       Reactions    Peanut-containing Drug Products Anaphylaxis   Citric Acid Rash        Medication List     STOP taking these medications    aspirin  EC 81 MG tablet   cyclobenzaprine  10 MG tablet Commonly known as: FLEXERIL    metoCLOPramide  10 MG tablet Commonly known as: REGLAN        TAKE these medications    acetaminophen  500 MG tablet Commonly known as: TYLENOL  Take 2 tablets (1,000 mg total) by mouth every 6 (six) hours as needed for mild pain (pain score 1-3).   albuterol  108 (90 Base) MCG/ACT inhaler Commonly known as: VENTOLIN  HFA Inhale 1-2 puffs into the lungs every 6 (six) hours as needed for wheezing or shortness of breath.   Blood Pressure Kit Devi 1 Device by Does not apply route as needed.   FeroSul 325 (65 Fe) MG tablet Generic drug: ferrous sulfate Take 1 tablet (325 mg total) by mouth every Monday, Wednesday, and Friday.   hydrOXYzine  25 MG tablet Commonly known as: ATARAX  Take 1 tablet (25 mg total) by mouth every 6 (six) hours as needed. What changed: reasons to take this   ibuprofen  600 MG tablet Commonly known as: ADVIL  Take 1 tablet (600 mg total) by mouth every 6 (six) hours.   oxyCODONE  5 MG immediate release tablet Commonly known as: Oxy IR/ROXICODONE  Take 1 tablet (5 mg total) by mouth every 6 (six) hours as needed for severe pain (pain score 7-10).   PrePLUS 27-1 MG Tabs Take 1 tablet by mouth daily.   Stool Softener/Laxative 50-8.6 MG tablet Generic drug: senna-docusate Take 2 tablets by mouth at bedtime as needed for mild constipation or moderate constipation.         Discharge home in stable condition Infant Feeding: Bottle and Breast Infant Disposition:home with mother Discharge instruction: per After Visit Summary and Postpartum booklet. Activity: Advance as tolerated. Pelvic rest for 6 weeks.  Diet: routine diet Future Appointments: Future Appointments  Date Time Provider Department Center  09/23/2024  3:15 PM Ilean Norleen GAILS, MD  [facility]     09/23/2024 Charlie DELENA Courts, MD

## 2024-10-01 ENCOUNTER — Telehealth (HOSPITAL_COMMUNITY): Payer: Self-pay | Admitting: *Deleted

## 2024-10-01 ENCOUNTER — Ambulatory Visit

## 2024-10-01 NOTE — Telephone Encounter (Signed)
 10/01/2024  Name: [name] MRN: [id number] DOB: 09-Oct-2006  Reason for Call:  Transition of Care Hospital Discharge Call  Contact Status: Patient Contact Status: Complete  Language assistant needed: Interpreter Mode: Interpreter Not Needed        Follow-Up Questions: Do You Have Any Concerns About Your Health As You Heal From Delivery?: Yes What Concerns Do You Have About Your Health?: Patient says she is still having incisional pain. She notes that one side is numb, but the other side is still painful.  She is taking all the medicines they gave me. Denies fever, incisional drainage, or redness, but does feel a lump on one aspect of incision. Encouraged patient to call her OB office for incision check. Patient agreeable to this plan. Do You Have Any Concerns About Your Infants Health?: Yes What Concerns Do You Have About Your Baby?: Patient notes a red spot like a rash on the baby's scalp. Baby is eating well and seems to be fine to mother. Baby has not seen pediatrician since discharge and patient said she needs to call peds office for appointment. Strongly encouraged patient to call office for an appointment.  Edinburgh Postnatal Depression Scale:  In the Past 7 Days:    PHQ2-9 Depression Scale:     Discharge Follow-up: Edinburgh score requires follow up?:  (declines screening today, endorses she is doing well emotionally) Patient was advised of the following resources:: Breastfeeding Support Group, Support Group (declines postpartum group information via email)  Post-discharge interventions: Reviewed Newborn Safe Sleep Practices  Mliss Sieve, RN 10/01/2024 17:11

## 2024-10-01 NOTE — Progress Notes (Deleted)
 Incision Check Visit  Chelsea Herman is here for incision check following {c-section:28037} c-section on ***.   Assessment:  Education: Reviewed good wound care and s/s of infection with patient.  Patient will follow up {Nurse Visit Follow Up:28038}.  Cyndee JAYSON Molt, RN 10/01/2024  1:28 PM

## 2024-10-30 ENCOUNTER — Ambulatory Visit: Admitting: Family Medicine

## 2024-10-30 ENCOUNTER — Encounter: Payer: Self-pay | Admitting: *Deleted
